# Patient Record
Sex: Female | Born: 1970 | Race: White | Hispanic: No | Marital: Single | Smoking: Current every day smoker
Health system: Southern US, Community
[De-identification: ages and names within clinical notes are randomized; demographics above are authoritative.]

## PROBLEM LIST (undated history)

## (undated) DIAGNOSIS — F419 Anxiety disorder, unspecified: Secondary | ICD-10-CM

---

## 1997-09-19 ENCOUNTER — Emergency Department (HOSPITAL_COMMUNITY): Admission: EM | Admit: 1997-09-19 | Discharge: 1997-09-19 | Payer: Self-pay | Admitting: Emergency Medicine

## 1998-01-02 ENCOUNTER — Emergency Department (HOSPITAL_COMMUNITY): Admission: EM | Admit: 1998-01-02 | Discharge: 1998-01-02 | Payer: Self-pay | Admitting: Internal Medicine

## 1998-03-16 ENCOUNTER — Inpatient Hospital Stay (HOSPITAL_COMMUNITY): Admission: AD | Admit: 1998-03-16 | Discharge: 1998-03-16 | Payer: Self-pay | Admitting: Obstetrics

## 1999-06-04 ENCOUNTER — Emergency Department (HOSPITAL_COMMUNITY): Admission: EM | Admit: 1999-06-04 | Discharge: 1999-06-04 | Payer: Self-pay | Admitting: Emergency Medicine

## 1999-07-17 ENCOUNTER — Encounter: Payer: Self-pay | Admitting: Emergency Medicine

## 1999-07-17 ENCOUNTER — Emergency Department (HOSPITAL_COMMUNITY): Admission: EM | Admit: 1999-07-17 | Discharge: 1999-07-17 | Payer: Self-pay | Admitting: Emergency Medicine

## 1999-07-20 ENCOUNTER — Emergency Department (HOSPITAL_COMMUNITY): Admission: EM | Admit: 1999-07-20 | Discharge: 1999-07-20 | Payer: Self-pay | Admitting: Emergency Medicine

## 1999-07-23 ENCOUNTER — Inpatient Hospital Stay (HOSPITAL_COMMUNITY): Admission: AD | Admit: 1999-07-23 | Discharge: 1999-07-23 | Payer: Self-pay | Admitting: Obstetrics & Gynecology

## 1999-07-24 ENCOUNTER — Inpatient Hospital Stay (HOSPITAL_COMMUNITY): Admission: AD | Admit: 1999-07-24 | Discharge: 1999-07-24 | Payer: Self-pay | Admitting: Obstetrics

## 1999-12-26 ENCOUNTER — Encounter: Payer: Self-pay | Admitting: *Deleted

## 1999-12-26 ENCOUNTER — Inpatient Hospital Stay (HOSPITAL_COMMUNITY): Admission: EM | Admit: 1999-12-26 | Discharge: 1999-12-26 | Payer: Self-pay | Admitting: *Deleted

## 2000-03-09 ENCOUNTER — Inpatient Hospital Stay (HOSPITAL_COMMUNITY): Admission: AD | Admit: 2000-03-09 | Discharge: 2000-03-09 | Payer: Self-pay | Admitting: *Deleted

## 2000-03-09 ENCOUNTER — Encounter: Payer: Self-pay | Admitting: *Deleted

## 2000-06-05 ENCOUNTER — Encounter: Admission: RE | Admit: 2000-06-05 | Discharge: 2000-06-05 | Payer: Self-pay | Admitting: Family Medicine

## 2000-07-04 ENCOUNTER — Encounter: Admission: RE | Admit: 2000-07-04 | Discharge: 2000-07-04 | Payer: Self-pay | Admitting: Family Medicine

## 2000-07-22 ENCOUNTER — Encounter: Admission: RE | Admit: 2000-07-22 | Discharge: 2000-07-22 | Payer: Self-pay | Admitting: Family Medicine

## 2000-07-22 ENCOUNTER — Other Ambulatory Visit: Admission: RE | Admit: 2000-07-22 | Discharge: 2000-07-22 | Payer: Self-pay | Admitting: *Deleted

## 2000-07-24 ENCOUNTER — Encounter: Admission: RE | Admit: 2000-07-24 | Discharge: 2000-07-24 | Payer: Self-pay | Admitting: Family Medicine

## 2000-08-21 ENCOUNTER — Encounter: Admission: RE | Admit: 2000-08-21 | Discharge: 2000-08-21 | Payer: Self-pay | Admitting: Family Medicine

## 2000-08-21 ENCOUNTER — Ambulatory Visit (HOSPITAL_COMMUNITY): Admission: RE | Admit: 2000-08-21 | Discharge: 2000-08-21 | Payer: Self-pay | Admitting: *Deleted

## 2000-08-25 ENCOUNTER — Inpatient Hospital Stay (HOSPITAL_COMMUNITY): Admission: AD | Admit: 2000-08-25 | Discharge: 2000-08-25 | Payer: Self-pay | Admitting: Obstetrics

## 2000-09-04 ENCOUNTER — Encounter: Admission: RE | Admit: 2000-09-04 | Discharge: 2000-09-04 | Payer: Self-pay | Admitting: *Deleted

## 2000-09-12 ENCOUNTER — Encounter: Admission: RE | Admit: 2000-09-12 | Discharge: 2000-09-12 | Payer: Self-pay | Admitting: Obstetrics

## 2000-09-18 ENCOUNTER — Observation Stay (HOSPITAL_COMMUNITY): Admission: AD | Admit: 2000-09-18 | Discharge: 2000-09-18 | Payer: Self-pay | Admitting: *Deleted

## 2000-09-21 ENCOUNTER — Inpatient Hospital Stay (HOSPITAL_COMMUNITY): Admission: AD | Admit: 2000-09-21 | Discharge: 2000-09-21 | Payer: Self-pay | Admitting: *Deleted

## 2000-09-26 ENCOUNTER — Ambulatory Visit (HOSPITAL_COMMUNITY): Admission: RE | Admit: 2000-09-26 | Discharge: 2000-09-26 | Payer: Self-pay | Admitting: Obstetrics

## 2000-09-26 ENCOUNTER — Encounter: Payer: Self-pay | Admitting: Obstetrics

## 2000-09-26 ENCOUNTER — Encounter: Admission: RE | Admit: 2000-09-26 | Discharge: 2000-09-26 | Payer: Self-pay | Admitting: Obstetrics

## 2000-10-10 ENCOUNTER — Encounter: Payer: Self-pay | Admitting: Obstetrics

## 2000-10-10 ENCOUNTER — Ambulatory Visit (HOSPITAL_COMMUNITY): Admission: RE | Admit: 2000-10-10 | Discharge: 2000-10-10 | Payer: Self-pay | Admitting: Obstetrics

## 2000-10-10 ENCOUNTER — Encounter: Admission: RE | Admit: 2000-10-10 | Discharge: 2000-10-10 | Payer: Self-pay | Admitting: Obstetrics

## 2000-10-24 ENCOUNTER — Encounter: Admission: RE | Admit: 2000-10-24 | Discharge: 2000-10-24 | Payer: Self-pay | Admitting: Obstetrics

## 2000-11-13 ENCOUNTER — Inpatient Hospital Stay (HOSPITAL_COMMUNITY): Admission: AD | Admit: 2000-11-13 | Discharge: 2000-11-13 | Payer: Self-pay | Admitting: Obstetrics

## 2000-11-14 ENCOUNTER — Encounter: Admission: RE | Admit: 2000-11-14 | Discharge: 2000-11-14 | Payer: Self-pay | Admitting: Obstetrics

## 2000-11-14 ENCOUNTER — Ambulatory Visit (HOSPITAL_COMMUNITY): Admission: RE | Admit: 2000-11-14 | Discharge: 2000-11-14 | Payer: Self-pay | Admitting: Obstetrics

## 2000-11-28 ENCOUNTER — Encounter: Admission: RE | Admit: 2000-11-28 | Discharge: 2000-11-28 | Payer: Self-pay | Admitting: Obstetrics

## 2000-11-29 ENCOUNTER — Observation Stay (HOSPITAL_COMMUNITY): Admission: AD | Admit: 2000-11-29 | Discharge: 2000-11-30 | Payer: Self-pay | Admitting: Obstetrics

## 2000-11-29 ENCOUNTER — Encounter: Payer: Self-pay | Admitting: Obstetrics

## 2000-12-05 ENCOUNTER — Encounter: Admission: RE | Admit: 2000-12-05 | Discharge: 2000-12-05 | Payer: Self-pay | Admitting: Family Medicine

## 2001-01-01 ENCOUNTER — Encounter: Admission: RE | Admit: 2001-01-01 | Discharge: 2001-01-01 | Payer: Self-pay | Admitting: Family Medicine

## 2001-03-04 ENCOUNTER — Encounter: Admission: RE | Admit: 2001-03-04 | Discharge: 2001-03-04 | Payer: Self-pay | Admitting: Sports Medicine

## 2001-04-17 ENCOUNTER — Encounter: Admission: RE | Admit: 2001-04-17 | Discharge: 2001-04-17 | Payer: Self-pay | Admitting: Family Medicine

## 2001-04-27 ENCOUNTER — Inpatient Hospital Stay (HOSPITAL_COMMUNITY): Admission: AD | Admit: 2001-04-27 | Discharge: 2001-04-27 | Payer: Self-pay | Admitting: *Deleted

## 2001-05-12 ENCOUNTER — Encounter: Admission: RE | Admit: 2001-05-12 | Discharge: 2001-05-12 | Payer: Self-pay | Admitting: Family Medicine

## 2001-05-13 ENCOUNTER — Ambulatory Visit (HOSPITAL_COMMUNITY): Admission: RE | Admit: 2001-05-13 | Discharge: 2001-05-13 | Payer: Self-pay | Admitting: *Deleted

## 2001-05-18 ENCOUNTER — Inpatient Hospital Stay (HOSPITAL_COMMUNITY): Admission: AD | Admit: 2001-05-18 | Discharge: 2001-05-18 | Payer: Self-pay | Admitting: Obstetrics & Gynecology

## 2001-05-29 ENCOUNTER — Encounter: Admission: RE | Admit: 2001-05-29 | Discharge: 2001-05-29 | Payer: Self-pay | Admitting: Family Medicine

## 2001-06-01 ENCOUNTER — Inpatient Hospital Stay (HOSPITAL_COMMUNITY): Admission: AD | Admit: 2001-06-01 | Discharge: 2001-06-01 | Payer: Self-pay | Admitting: Obstetrics & Gynecology

## 2001-06-03 ENCOUNTER — Ambulatory Visit (HOSPITAL_COMMUNITY): Admission: RE | Admit: 2001-06-03 | Discharge: 2001-06-03 | Payer: Self-pay | Admitting: *Deleted

## 2001-06-06 ENCOUNTER — Encounter (INDEPENDENT_AMBULATORY_CARE_PROVIDER_SITE_OTHER): Payer: Self-pay | Admitting: Specialist

## 2001-06-06 ENCOUNTER — Encounter: Admission: RE | Admit: 2001-06-06 | Discharge: 2001-06-06 | Payer: Self-pay | Admitting: Family Medicine

## 2001-06-06 ENCOUNTER — Other Ambulatory Visit: Admission: RE | Admit: 2001-06-06 | Discharge: 2001-06-06 | Payer: Self-pay | Admitting: *Deleted

## 2001-06-08 ENCOUNTER — Inpatient Hospital Stay (HOSPITAL_COMMUNITY): Admission: AD | Admit: 2001-06-08 | Discharge: 2001-06-08 | Payer: Self-pay | Admitting: Obstetrics

## 2001-06-30 ENCOUNTER — Emergency Department (HOSPITAL_COMMUNITY): Admission: EM | Admit: 2001-06-30 | Discharge: 2001-06-30 | Payer: Self-pay | Admitting: Emergency Medicine

## 2001-07-04 ENCOUNTER — Observation Stay (HOSPITAL_COMMUNITY): Admission: RE | Admit: 2001-07-04 | Discharge: 2001-07-05 | Payer: Self-pay | Admitting: *Deleted

## 2001-07-04 ENCOUNTER — Encounter: Payer: Self-pay | Admitting: *Deleted

## 2001-07-07 ENCOUNTER — Inpatient Hospital Stay (HOSPITAL_COMMUNITY): Admission: AD | Admit: 2001-07-07 | Discharge: 2001-07-07 | Payer: Self-pay | Admitting: Obstetrics & Gynecology

## 2001-07-11 ENCOUNTER — Encounter (HOSPITAL_COMMUNITY): Admission: AD | Admit: 2001-07-11 | Discharge: 2001-08-10 | Payer: Self-pay | Admitting: *Deleted

## 2001-08-12 ENCOUNTER — Encounter (HOSPITAL_COMMUNITY): Admission: RE | Admit: 2001-08-12 | Discharge: 2001-09-11 | Payer: Self-pay | Admitting: *Deleted

## 2001-08-17 ENCOUNTER — Inpatient Hospital Stay (HOSPITAL_COMMUNITY): Admission: AD | Admit: 2001-08-17 | Discharge: 2001-08-17 | Payer: Self-pay | Admitting: Obstetrics and Gynecology

## 2001-08-17 ENCOUNTER — Encounter: Payer: Self-pay | Admitting: Obstetrics and Gynecology

## 2001-08-27 ENCOUNTER — Inpatient Hospital Stay (HOSPITAL_COMMUNITY): Admission: AD | Admit: 2001-08-27 | Discharge: 2001-08-27 | Payer: Self-pay | Admitting: Obstetrics and Gynecology

## 2001-09-12 ENCOUNTER — Observation Stay (HOSPITAL_COMMUNITY): Admission: AD | Admit: 2001-09-12 | Discharge: 2001-09-12 | Payer: Self-pay | Admitting: *Deleted

## 2001-09-14 ENCOUNTER — Inpatient Hospital Stay (HOSPITAL_COMMUNITY): Admission: AD | Admit: 2001-09-14 | Discharge: 2001-09-14 | Payer: Self-pay | Admitting: Obstetrics and Gynecology

## 2001-09-17 ENCOUNTER — Inpatient Hospital Stay (HOSPITAL_COMMUNITY): Admission: AD | Admit: 2001-09-17 | Discharge: 2001-09-17 | Payer: Self-pay | Admitting: Obstetrics and Gynecology

## 2001-09-19 ENCOUNTER — Inpatient Hospital Stay (HOSPITAL_COMMUNITY): Admission: AD | Admit: 2001-09-19 | Discharge: 2001-09-19 | Payer: Self-pay | Admitting: *Deleted

## 2001-09-19 ENCOUNTER — Encounter: Payer: Self-pay | Admitting: *Deleted

## 2001-09-20 ENCOUNTER — Encounter (INDEPENDENT_AMBULATORY_CARE_PROVIDER_SITE_OTHER): Payer: Self-pay

## 2001-09-20 ENCOUNTER — Observation Stay (HOSPITAL_COMMUNITY): Admission: AD | Admit: 2001-09-20 | Discharge: 2001-09-20 | Payer: Self-pay | Admitting: *Deleted

## 2001-09-25 ENCOUNTER — Encounter: Admission: RE | Admit: 2001-09-25 | Discharge: 2001-09-25 | Payer: Self-pay | Admitting: Family Medicine

## 2001-09-30 ENCOUNTER — Encounter: Admission: RE | Admit: 2001-09-30 | Discharge: 2001-09-30 | Payer: Self-pay | Admitting: Family Medicine

## 2001-10-06 ENCOUNTER — Encounter: Admission: RE | Admit: 2001-10-06 | Discharge: 2001-10-06 | Payer: Self-pay | Admitting: Family Medicine

## 2001-10-06 ENCOUNTER — Inpatient Hospital Stay (HOSPITAL_COMMUNITY): Admission: EM | Admit: 2001-10-06 | Discharge: 2001-10-08 | Payer: Self-pay | Admitting: Psychiatry

## 2001-10-22 ENCOUNTER — Encounter: Admission: RE | Admit: 2001-10-22 | Discharge: 2001-10-22 | Payer: Self-pay | Admitting: Family Medicine

## 2001-12-04 ENCOUNTER — Encounter: Admission: RE | Admit: 2001-12-04 | Discharge: 2001-12-04 | Payer: Self-pay | Admitting: Family Medicine

## 2002-01-06 ENCOUNTER — Encounter: Admission: RE | Admit: 2002-01-06 | Discharge: 2002-01-06 | Payer: Self-pay | Admitting: Family Medicine

## 2002-01-09 ENCOUNTER — Encounter: Admission: RE | Admit: 2002-01-09 | Discharge: 2002-01-09 | Payer: Self-pay | Admitting: Family Medicine

## 2002-01-24 ENCOUNTER — Inpatient Hospital Stay (HOSPITAL_COMMUNITY): Admission: AD | Admit: 2002-01-24 | Discharge: 2002-01-24 | Payer: Self-pay | Admitting: *Deleted

## 2002-03-19 ENCOUNTER — Emergency Department (HOSPITAL_COMMUNITY): Admission: EM | Admit: 2002-03-19 | Discharge: 2002-03-19 | Payer: Self-pay | Admitting: Emergency Medicine

## 2002-04-15 ENCOUNTER — Encounter: Admission: RE | Admit: 2002-04-15 | Discharge: 2002-04-15 | Payer: Self-pay | Admitting: Family Medicine

## 2007-12-21 ENCOUNTER — Emergency Department (HOSPITAL_COMMUNITY): Admission: EM | Admit: 2007-12-21 | Discharge: 2007-12-21 | Payer: Self-pay | Admitting: Emergency Medicine

## 2008-03-09 ENCOUNTER — Emergency Department (HOSPITAL_COMMUNITY): Admission: EM | Admit: 2008-03-09 | Discharge: 2008-03-09 | Payer: Self-pay | Admitting: Emergency Medicine

## 2008-10-01 ENCOUNTER — Emergency Department (HOSPITAL_COMMUNITY): Admission: EM | Admit: 2008-10-01 | Discharge: 2008-10-01 | Payer: Self-pay | Admitting: Emergency Medicine

## 2008-11-16 ENCOUNTER — Inpatient Hospital Stay (HOSPITAL_COMMUNITY): Admission: AD | Admit: 2008-11-16 | Discharge: 2008-11-16 | Payer: Self-pay | Admitting: Obstetrics & Gynecology

## 2009-01-11 ENCOUNTER — Ambulatory Visit: Payer: Self-pay | Admitting: Obstetrics and Gynecology

## 2009-01-26 ENCOUNTER — Inpatient Hospital Stay (HOSPITAL_COMMUNITY): Admission: AD | Admit: 2009-01-26 | Discharge: 2009-01-26 | Payer: Self-pay | Admitting: Obstetrics & Gynecology

## 2009-01-27 ENCOUNTER — Ambulatory Visit: Payer: Self-pay | Admitting: Obstetrics & Gynecology

## 2009-01-27 ENCOUNTER — Encounter (INDEPENDENT_AMBULATORY_CARE_PROVIDER_SITE_OTHER): Payer: Self-pay | Admitting: *Deleted

## 2009-01-27 LAB — CONVERTED CEMR LAB
Clue Cells Wet Prep HPF POC: NONE SEEN
GC Probe Amp, Genital: NEGATIVE

## 2009-01-28 ENCOUNTER — Ambulatory Visit (HOSPITAL_COMMUNITY): Admission: RE | Admit: 2009-01-28 | Discharge: 2009-01-28 | Payer: Self-pay | Admitting: Obstetrics & Gynecology

## 2009-02-03 ENCOUNTER — Ambulatory Visit: Payer: Self-pay | Admitting: Family Medicine

## 2009-02-03 ENCOUNTER — Ambulatory Visit (HOSPITAL_COMMUNITY): Admission: RE | Admit: 2009-02-03 | Discharge: 2009-02-03 | Payer: Self-pay | Admitting: Obstetrics and Gynecology

## 2009-02-03 ENCOUNTER — Encounter: Payer: Self-pay | Admitting: Obstetrics and Gynecology

## 2009-02-03 LAB — CONVERTED CEMR LAB: Hepatitis B Surface Ag: NEGATIVE

## 2009-02-21 ENCOUNTER — Ambulatory Visit: Payer: Self-pay | Admitting: Obstetrics and Gynecology

## 2009-02-21 ENCOUNTER — Inpatient Hospital Stay (HOSPITAL_COMMUNITY): Admission: AD | Admit: 2009-02-21 | Discharge: 2009-02-21 | Payer: Self-pay | Admitting: Obstetrics & Gynecology

## 2009-02-24 ENCOUNTER — Ambulatory Visit: Payer: Self-pay | Admitting: Obstetrics & Gynecology

## 2009-02-24 ENCOUNTER — Encounter: Payer: Self-pay | Admitting: Obstetrics and Gynecology

## 2009-02-25 ENCOUNTER — Encounter: Payer: Self-pay | Admitting: Obstetrics and Gynecology

## 2009-02-25 LAB — CONVERTED CEMR LAB
Amphetamine Screen, Ur: NEGATIVE
Barbiturate Quant, Ur: NEGATIVE
Marijuana Metabolite: POSITIVE — AB
Methadone: NEGATIVE
Opiate Screen, Urine: NEGATIVE

## 2009-03-03 ENCOUNTER — Ambulatory Visit (HOSPITAL_COMMUNITY): Admission: RE | Admit: 2009-03-03 | Discharge: 2009-03-03 | Payer: Self-pay | Admitting: Obstetrics & Gynecology

## 2009-03-03 ENCOUNTER — Ambulatory Visit: Payer: Self-pay | Admitting: Obstetrics & Gynecology

## 2009-03-17 ENCOUNTER — Ambulatory Visit: Payer: Self-pay | Admitting: Obstetrics & Gynecology

## 2009-03-17 ENCOUNTER — Encounter: Payer: Self-pay | Admitting: Physician Assistant

## 2009-03-24 ENCOUNTER — Inpatient Hospital Stay (HOSPITAL_COMMUNITY): Admission: AD | Admit: 2009-03-24 | Discharge: 2009-03-24 | Payer: Self-pay | Admitting: Family Medicine

## 2009-03-28 ENCOUNTER — Ambulatory Visit: Payer: Self-pay | Admitting: Family Medicine

## 2009-04-09 ENCOUNTER — Inpatient Hospital Stay (HOSPITAL_COMMUNITY): Admission: AD | Admit: 2009-04-09 | Discharge: 2009-04-09 | Payer: Self-pay | Admitting: Obstetrics & Gynecology

## 2009-04-18 ENCOUNTER — Ambulatory Visit: Payer: Self-pay | Admitting: Obstetrics & Gynecology

## 2009-04-19 ENCOUNTER — Ambulatory Visit: Payer: Self-pay | Admitting: Obstetrics and Gynecology

## 2009-04-19 ENCOUNTER — Inpatient Hospital Stay (HOSPITAL_COMMUNITY): Admission: AD | Admit: 2009-04-19 | Discharge: 2009-04-19 | Payer: Self-pay | Admitting: Obstetrics and Gynecology

## 2009-04-22 ENCOUNTER — Inpatient Hospital Stay (HOSPITAL_COMMUNITY): Admission: AD | Admit: 2009-04-22 | Discharge: 2009-04-22 | Payer: Self-pay | Admitting: Obstetrics & Gynecology

## 2009-05-05 ENCOUNTER — Ambulatory Visit: Payer: Self-pay | Admitting: Obstetrics & Gynecology

## 2009-05-05 ENCOUNTER — Encounter: Payer: Self-pay | Admitting: Obstetrics and Gynecology

## 2009-05-06 ENCOUNTER — Encounter: Payer: Self-pay | Admitting: Obstetrics and Gynecology

## 2009-05-07 ENCOUNTER — Ambulatory Visit: Payer: Self-pay | Admitting: Advanced Practice Midwife

## 2009-05-07 ENCOUNTER — Inpatient Hospital Stay (HOSPITAL_COMMUNITY): Admission: AD | Admit: 2009-05-07 | Discharge: 2009-05-07 | Payer: Self-pay | Admitting: Obstetrics & Gynecology

## 2009-05-09 ENCOUNTER — Ambulatory Visit: Payer: Self-pay | Admitting: Family

## 2009-05-09 ENCOUNTER — Inpatient Hospital Stay (HOSPITAL_COMMUNITY): Admission: RE | Admit: 2009-05-09 | Discharge: 2009-05-09 | Payer: Self-pay | Admitting: Family Medicine

## 2009-05-09 ENCOUNTER — Ambulatory Visit: Payer: Self-pay | Admitting: Obstetrics & Gynecology

## 2009-05-10 ENCOUNTER — Encounter: Payer: Self-pay | Admitting: Obstetrics and Gynecology

## 2009-05-12 ENCOUNTER — Ambulatory Visit: Payer: Self-pay | Admitting: Obstetrics and Gynecology

## 2009-05-12 ENCOUNTER — Inpatient Hospital Stay (HOSPITAL_COMMUNITY): Admission: AD | Admit: 2009-05-12 | Discharge: 2009-05-16 | Payer: Self-pay | Admitting: Obstetrics and Gynecology

## 2009-05-20 ENCOUNTER — Inpatient Hospital Stay (HOSPITAL_COMMUNITY): Admission: AD | Admit: 2009-05-20 | Discharge: 2009-05-20 | Payer: Self-pay | Admitting: Obstetrics & Gynecology

## 2009-05-20 ENCOUNTER — Ambulatory Visit: Payer: Self-pay | Admitting: Advanced Practice Midwife

## 2009-05-25 ENCOUNTER — Inpatient Hospital Stay (HOSPITAL_COMMUNITY): Admission: AD | Admit: 2009-05-25 | Discharge: 2009-05-26 | Payer: Self-pay | Admitting: *Deleted

## 2010-07-09 ENCOUNTER — Encounter: Payer: Self-pay | Admitting: *Deleted

## 2010-07-10 ENCOUNTER — Encounter: Payer: Self-pay | Admitting: *Deleted

## 2010-09-19 LAB — CBC
HCT: 34.1 % — ABNORMAL LOW (ref 36.0–46.0)
Hemoglobin: 11.5 g/dL — ABNORMAL LOW (ref 12.0–15.0)
MCHC: 33.6 g/dL (ref 30.0–36.0)
RDW: 13.4 % (ref 11.5–15.5)

## 2010-09-20 LAB — URIC ACID: Uric Acid, Serum: 4.8 mg/dL (ref 2.4–7.0)

## 2010-09-20 LAB — URINALYSIS, MICROSCOPIC ONLY
Bilirubin Urine: NEGATIVE
Protein, ur: NEGATIVE mg/dL
Urobilinogen, UA: 0.2 mg/dL (ref 0.0–1.0)

## 2010-09-20 LAB — COMPREHENSIVE METABOLIC PANEL
ALT: 13 U/L (ref 0–35)
ALT: 13 U/L (ref 0–35)
ALT: 16 U/L (ref 0–35)
ALT: 16 U/L (ref 0–35)
AST: 29 U/L (ref 0–37)
AST: 22 U/L (ref 0–37)
Albumin: 2.2 g/dL — ABNORMAL LOW (ref 3.5–5.2)
Albumin: 2.5 g/dL — ABNORMAL LOW (ref 3.5–5.2)
Albumin: 2.6 g/dL — ABNORMAL LOW (ref 3.5–5.2)
Albumin: 2.5 g/dL — ABNORMAL LOW (ref 3.5–5.2)
Alkaline Phosphatase: 120 U/L — ABNORMAL HIGH (ref 39–117)
Alkaline Phosphatase: 130 U/L — ABNORMAL HIGH (ref 39–117)
Alkaline Phosphatase: 143 U/L — ABNORMAL HIGH (ref 39–117)
BUN: 10 mg/dL (ref 6–23)
BUN: 6 mg/dL (ref 6–23)
BUN: 8 mg/dL (ref 6–23)
BUN: 9 mg/dL (ref 6–23)
CO2: 21 mEq/L (ref 19–32)
CO2: 23 mEq/L (ref 19–32)
CO2: 26 mEq/L (ref 19–32)
Calcium: 8.3 mg/dL — ABNORMAL LOW (ref 8.4–10.5)
Calcium: 8.3 mg/dL — ABNORMAL LOW (ref 8.4–10.5)
Calcium: 8.6 mg/dL (ref 8.4–10.5)
Calcium: 8.9 mg/dL (ref 8.4–10.5)
Calcium: 8.4 mg/dL (ref 8.4–10.5)
Chloride: 104 mEq/L (ref 96–112)
Creatinine, Ser: 0.57 mg/dL (ref 0.4–1.2)
Creatinine, Ser: 0.59 mg/dL (ref 0.4–1.2)
Creatinine, Ser: 0.63 mg/dL (ref 0.4–1.2)
Creatinine, Ser: 0.66 mg/dL (ref 0.4–1.2)
GFR calc Af Amer: >60 mL/min (ref >60)
GFR calc Af Amer: >60 mL/min (ref >60)
GFR calc Af Amer: >60 mL/min (ref >60)
GFR calc non Af Amer: >60 mL/min (ref >60)
GFR calc non Af Amer: >60 mL/min (ref >60)
GFR calc non Af Amer: >60 mL/min (ref >60)
Glucose, Bld: 90 mg/dL (ref 70–99)
Glucose, Bld: 93 mg/dL (ref 70–99)
Potassium: 3.3 mEq/L — ABNORMAL LOW (ref 3.5–5.1)
Potassium: 3.7 mEq/L (ref 3.5–5.1)
Sodium: 135 mEq/L (ref 135–145)
Sodium: 136 mEq/L (ref 135–145)
Sodium: 135 mEq/L (ref 135–145)
Total Bilirubin: 0.2 mg/dL — ABNORMAL LOW (ref 0.3–1.2)
Total Bilirubin: 0.5 mg/dL (ref 0.3–1.2)
Total Protein: 5.3 g/dL — ABNORMAL LOW (ref 6.0–8.3)
Total Protein: 5.7 g/dL — ABNORMAL LOW (ref 6.0–8.3)
Total Protein: 5.8 g/dL — ABNORMAL LOW (ref 6.0–8.3)
Total Protein: 5.7 g/dL — ABNORMAL LOW (ref 6.0–8.3)

## 2010-09-20 LAB — URINALYSIS, ROUTINE W REFLEX MICROSCOPIC
Bilirubin Urine: NEGATIVE
Bilirubin Urine: NEGATIVE
Bilirubin Urine: NEGATIVE
Glucose, UA: NEGATIVE mg/dL
Glucose, UA: NEGATIVE mg/dL
Glucose, UA: NEGATIVE mg/dL
Hgb urine dipstick: NEGATIVE
Ketones, ur: 40 mg/dL — AB
Nitrite: NEGATIVE
Protein, ur: 30 mg/dL — AB
Protein, ur: 30 mg/dL — AB
Specific Gravity, Urine: 1.025 (ref 1.005–1.030)
Specific Gravity, Urine: >1.03 — ABNORMAL HIGH (ref 1.005–1.030)
Urobilinogen, UA: 0.2 mg/dL (ref 0.0–1.0)
pH: 6 (ref 5.0–8.0)
pH: 6.5 (ref 5.0–8.0)
pH: 6.5 (ref 5.0–8.0)

## 2010-09-20 LAB — CBC
HCT: 27.9 % — ABNORMAL LOW (ref 36.0–46.0)
HCT: 28.1 % — ABNORMAL LOW (ref 36.0–46.0)
HCT: 33.7 % — ABNORMAL LOW (ref 36.0–46.0)
HCT: 34.1 % — ABNORMAL LOW (ref 36.0–46.0)
HCT: 35.6 % — ABNORMAL LOW (ref 36.0–46.0)
Hemoglobin: 11.6 g/dL — ABNORMAL LOW (ref 12.0–15.0)
Hemoglobin: 9.3 g/dL — ABNORMAL LOW (ref 12.0–15.0)
Hemoglobin: 9.6 g/dL — ABNORMAL LOW (ref 12.0–15.0)
MCHC: 33.8 g/dL (ref 30.0–36.0)
MCHC: 33.9 g/dL (ref 30.0–36.0)
MCHC: 34.3 g/dL (ref 30.0–36.0)
MCHC: 34.4 g/dL (ref 30.0–36.0)
MCHC: 34.4 g/dL (ref 30.0–36.0)
MCV: 88 fL (ref 78.0–100.0)
MCV: 88.4 fL (ref 78.0–100.0)
MCV: 88.6 fL (ref 78.0–100.0)
MCV: 89.1 fL (ref 78.0–100.0)
MCV: 87.5 fL (ref 78.0–100.0)
Platelets: 175 10*3/uL (ref 150–400)
Platelets: 181 10*3/uL (ref 150–400)
Platelets: 192 10*3/uL (ref 150–400)
Platelets: 208 10*3/uL (ref 150–400)
RBC: 3.09 MIL/uL — ABNORMAL LOW (ref 3.87–5.11)
RBC: 3.16 MIL/uL — ABNORMAL LOW (ref 3.87–5.11)
RBC: 3.84 MIL/uL — ABNORMAL LOW (ref 3.87–5.11)
RBC: 4.05 MIL/uL (ref 3.87–5.11)
RBC: 3.72 MIL/uL — ABNORMAL LOW (ref 3.87–5.11)
RDW: 14.5 % (ref 11.5–15.5)
RDW: 14.6 % (ref 11.5–15.5)
RDW: 14.7 % (ref 11.5–15.5)
RDW: 14.9 % (ref 11.5–15.5)
RDW: 14.3 % (ref 11.5–15.5)
WBC: 13 10*3/uL — ABNORMAL HIGH (ref 4.0–10.5)

## 2010-09-20 LAB — URINE MICROSCOPIC-ADD ON

## 2010-09-20 LAB — RAPID URINE DRUG SCREEN, HOSP PERFORMED
Barbiturates: NOT DETECTED
Barbiturates: NOT DETECTED
Benzodiazepines: POSITIVE — AB
Benzodiazepines: NOT DETECTED
Opiates: NOT DETECTED

## 2010-09-20 LAB — WET PREP, GENITAL
Clue Cells Wet Prep HPF POC: NONE SEEN
Clue Cells Wet Prep HPF POC: NONE SEEN
Trich, Wet Prep: NONE SEEN
Yeast Wet Prep HPF POC: NONE SEEN

## 2010-09-20 LAB — LACTATE DEHYDROGENASE: LDH: 160 U/L (ref 94–250)

## 2010-09-20 LAB — PROTEIN, URINE, 24 HOUR
Collection Interval-UPROT: 24 hours
Protein, Urine: 27 mg/dL

## 2010-09-20 LAB — POCT URINALYSIS DIP (DEVICE)
Bilirubin Urine: NEGATIVE
Glucose, UA: NEGATIVE mg/dL
Glucose, UA: NEGATIVE mg/dL
Hgb urine dipstick: NEGATIVE
Ketones, ur: NEGATIVE mg/dL
Nitrite: NEGATIVE
Protein, ur: 100 mg/dL — AB
Specific Gravity, Urine: 1.025 (ref 1.005–1.030)
Specific Gravity, Urine: 1.02 (ref 1.005–1.030)
Urobilinogen, UA: 1 mg/dL (ref 0.0–1.0)
pH: 6.5 (ref 5.0–8.0)

## 2010-09-20 LAB — URINE CULTURE
Colony Count: NO GROWTH
Culture: NO GROWTH

## 2010-09-20 LAB — CREATININE CLEARANCE, URINE, 24 HOUR
Creatinine Clearance: 111 mL/min (ref 75–115)
Creatinine, 24H Ur: 945 mg/d (ref 700–1800)

## 2010-09-22 LAB — RAPID URINE DRUG SCREEN, HOSP PERFORMED
Amphetamines: NOT DETECTED
Benzodiazepines: POSITIVE — AB
Cocaine: POSITIVE — AB
Opiates: NOT DETECTED
Opiates: NOT DETECTED
Tetrahydrocannabinol: POSITIVE — AB
Tetrahydrocannabinol: POSITIVE — AB

## 2010-09-22 LAB — COMPREHENSIVE METABOLIC PANEL
Albumin: 2.7 g/dL — ABNORMAL LOW (ref 3.5–5.2)
BUN: 4 mg/dL — ABNORMAL LOW (ref 6–23)
Calcium: 9.3 mg/dL (ref 8.4–10.5)
Creatinine, Ser: 0.51 mg/dL (ref 0.4–1.2)
Potassium: 3.1 mEq/L — ABNORMAL LOW (ref 3.5–5.1)
Total Protein: 5.8 g/dL — ABNORMAL LOW (ref 6.0–8.3)

## 2010-09-22 LAB — POCT URINALYSIS DIP (DEVICE)
Hgb urine dipstick: NEGATIVE
Hgb urine dipstick: NEGATIVE
Ketones, ur: >=160 mg/dL — AB
Ketones, ur: NEGATIVE mg/dL
Ketones, ur: NEGATIVE mg/dL
Nitrite: NEGATIVE
Protein, ur: 30 mg/dL — AB
Protein, ur: NEGATIVE mg/dL
Protein, ur: 100 mg/dL — AB
Specific Gravity, Urine: 1.01 (ref 1.005–1.030)
Specific Gravity, Urine: 1.015 (ref 1.005–1.030)
Specific Gravity, Urine: 1.025 (ref 1.005–1.030)
Urobilinogen, UA: 2 mg/dL — ABNORMAL HIGH (ref 0.0–1.0)
pH: 6 (ref 5.0–8.0)
pH: 7 (ref 5.0–8.0)

## 2010-09-22 LAB — URINALYSIS, ROUTINE W REFLEX MICROSCOPIC
Bilirubin Urine: NEGATIVE
Hgb urine dipstick: NEGATIVE
Specific Gravity, Urine: 1.015 (ref 1.005–1.030)
Urobilinogen, UA: 1 mg/dL (ref 0.0–1.0)
pH: 6.5 (ref 5.0–8.0)

## 2010-09-22 LAB — CBC
HCT: 34.7 % — ABNORMAL LOW (ref 36.0–46.0)
MCV: 87.7 fL (ref 78.0–100.0)
Platelets: 260 10*3/uL (ref 150–400)
RDW: 14 % (ref 11.5–15.5)

## 2010-09-22 LAB — URINE MICROSCOPIC-ADD ON

## 2010-09-23 LAB — POCT URINALYSIS DIP (DEVICE)
Glucose, UA: NEGATIVE mg/dL
Protein, ur: NEGATIVE mg/dL
Specific Gravity, Urine: 1.02 (ref 1.005–1.030)
Urobilinogen, UA: 1 mg/dL (ref 0.0–1.0)

## 2010-09-23 IMAGING — CR DG CHEST 2V
2 series · 2 of 2 positions shown · non-contrast
Comparison: None.

CLINICAL DATA: 35-week pregnant patient presenting with coughing
and vomiting.  Smoker.

CHEST - 2 VIEW 04/22/2009:

[view not recorded (1 of 2)]
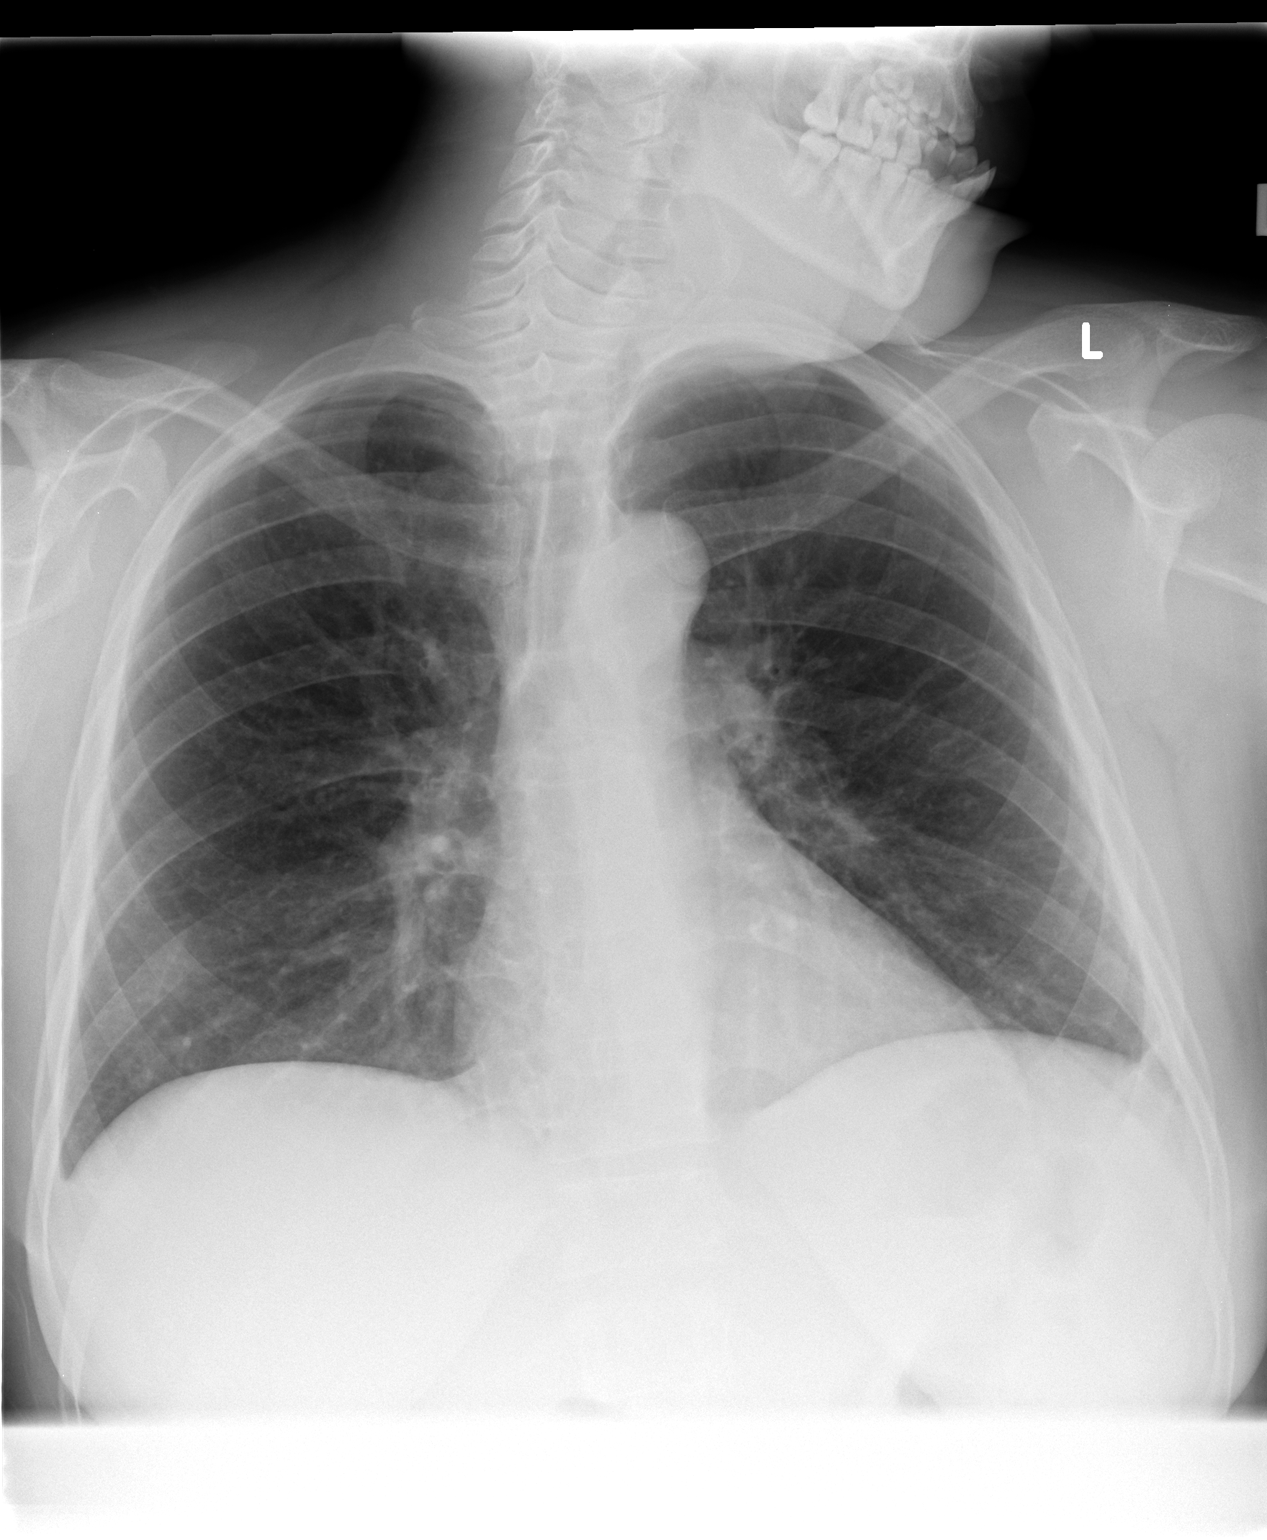

[view not recorded (2 of 2)]
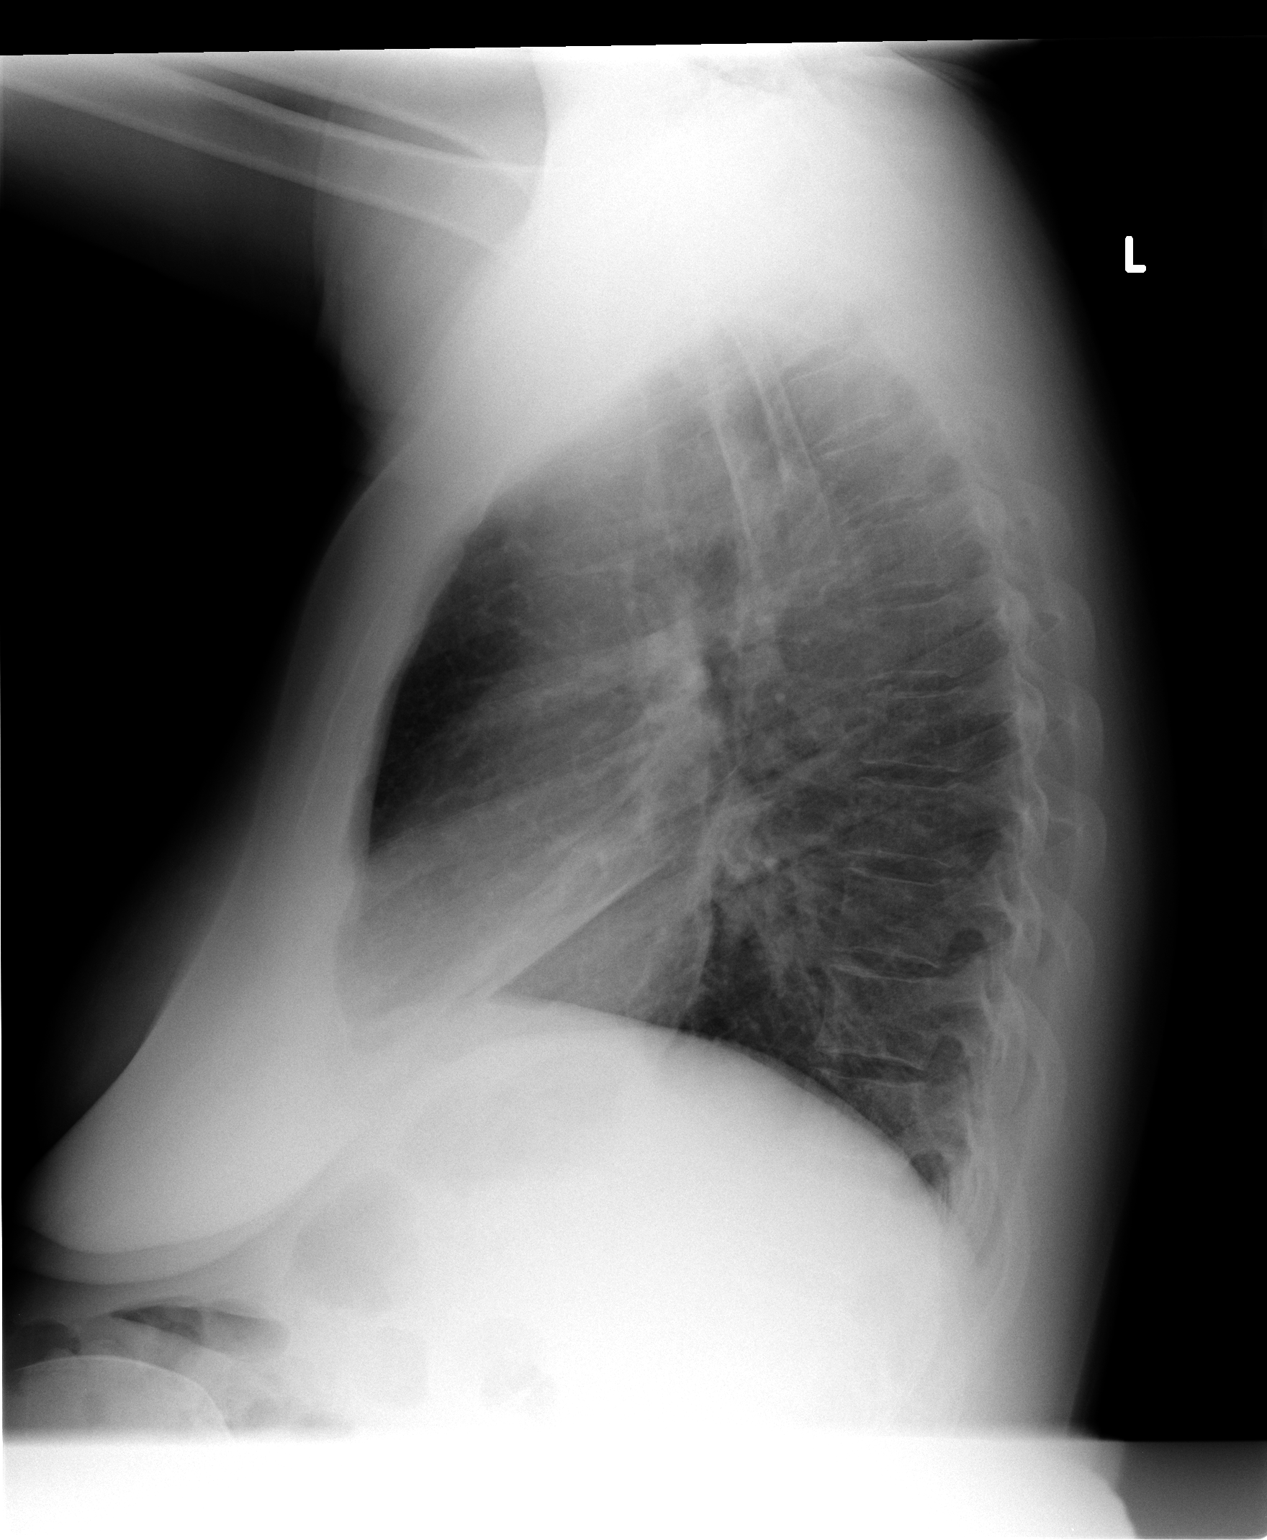

[2 of 2 positions shown; findings below may reference images not displayed]

FINDINGS: Heart size upper normal to perhaps slightly enlarged,
consistent with the gravid state.  Hilar and mediastinal contours
unremarkable.  Mildly prominent bronchovascular markings diffusely
and mild central peribronchial thickening.  Associated streaky
opacity in the lingula.  Lungs otherwise clear.  No pleural
effusions.  Visualized bony thorax intact.
IMPRESSION: Lingular atelectasis versus bronchopneumonia superimposed upon mild
changes of asthma and/or bronchitis.

## 2010-09-23 IMAGING — US US ABDOMEN COMPLETE
1 series · 14 of 25 positions shown · non-contrast
Comparison: None.

CLINICAL DATA: Pregnancy.  Flu symptoms.  Epigastric pain.
Burning for 5 days.  Nausea, vomiting.  The patient is 36 weeks.
No prior abdominal surgery.

COMPLETE ABDOMINAL ULTRASOUND

[Series 1: us abdomen complete · 0.32mm/px · 14 of 68 slices shown]
[im 1/68]
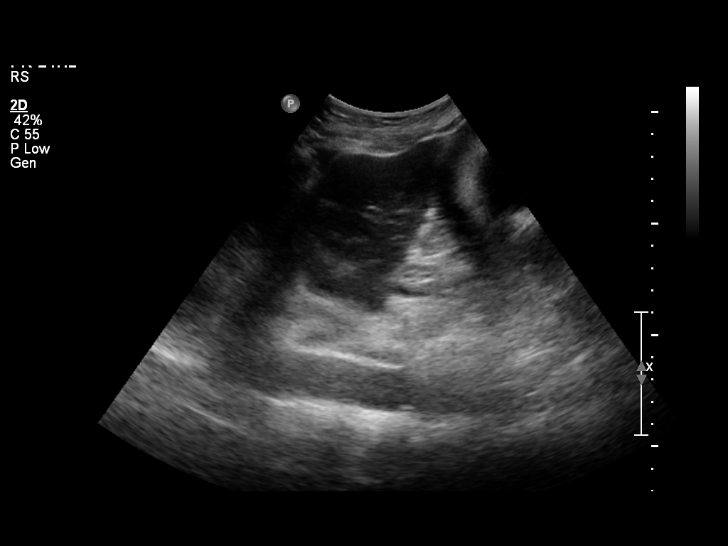
[im 6/68]
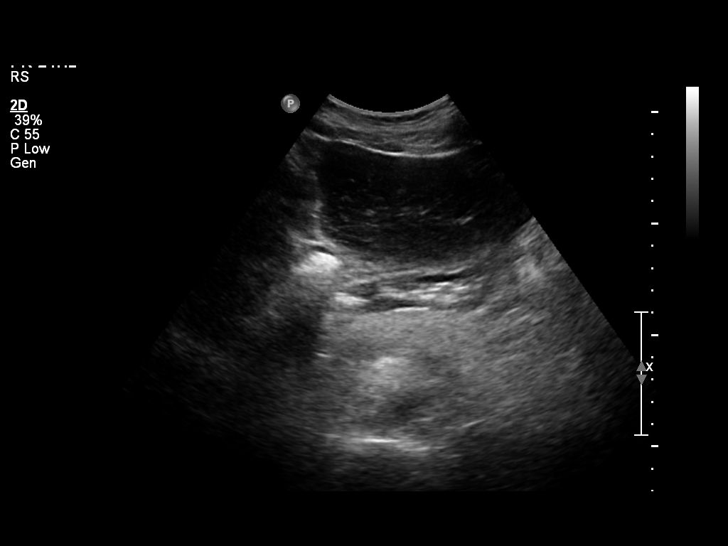
[im 12/68]
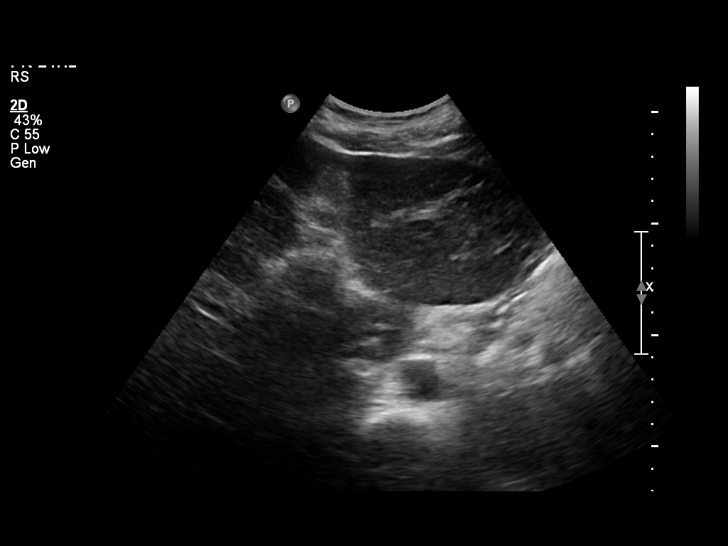
[im 17/68]
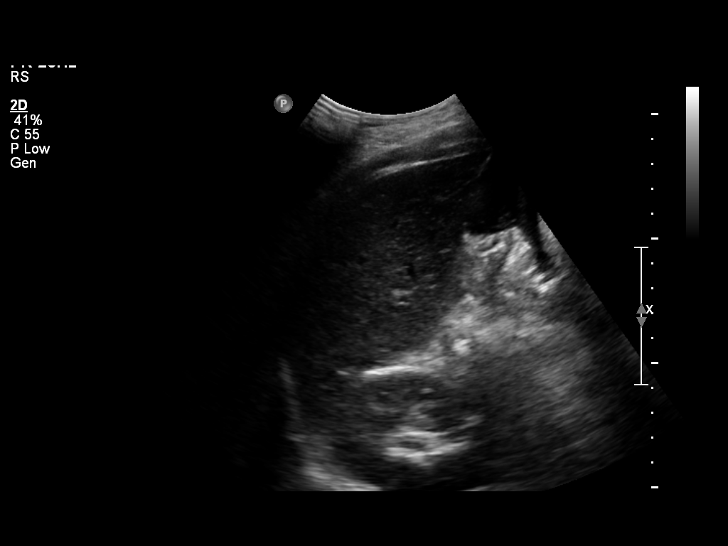
[im 23/68]
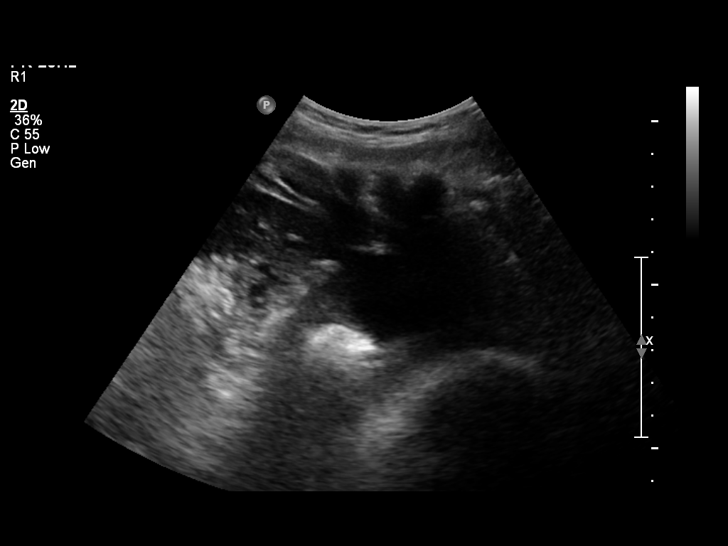
[im 26/68]
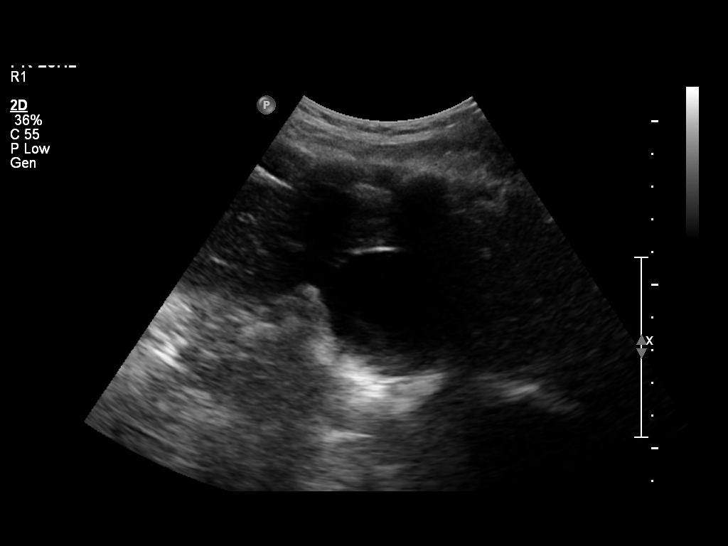
[im 31/68]
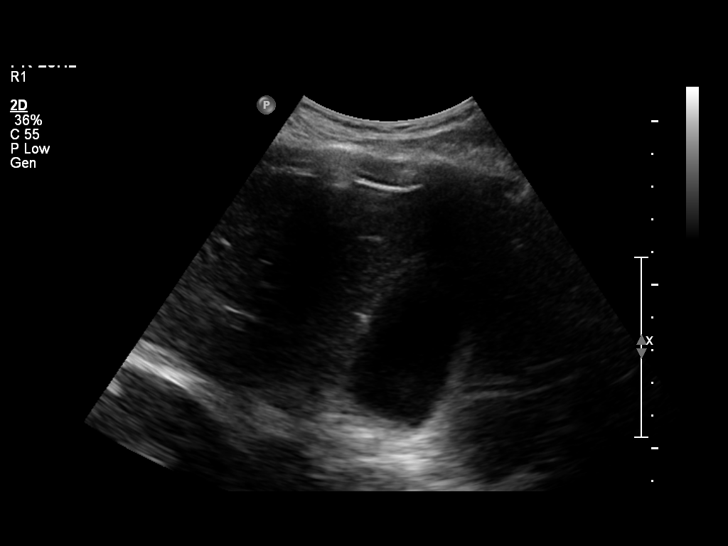
[im 37/68]
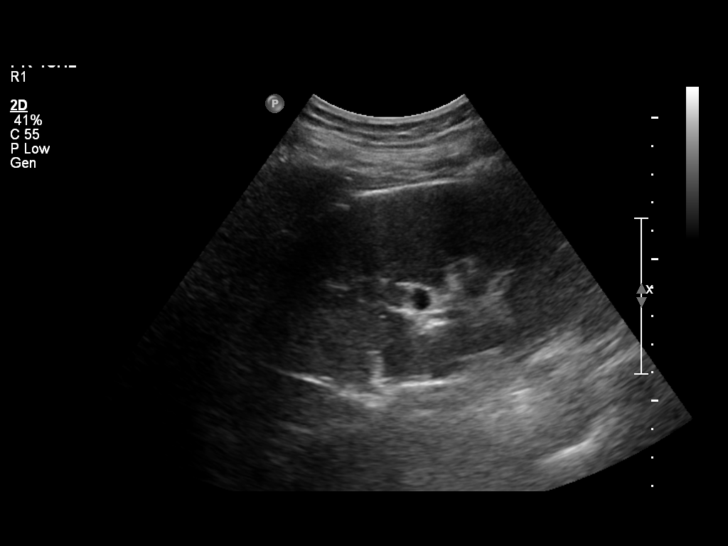
[im 42/68]
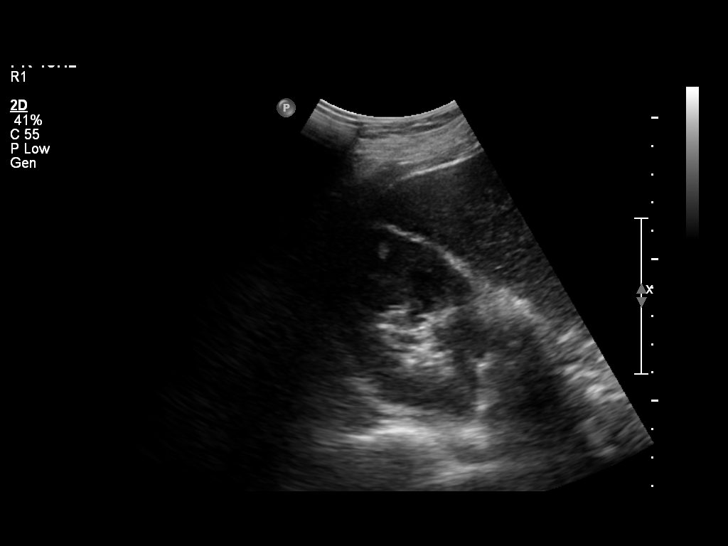
[im 45/68]
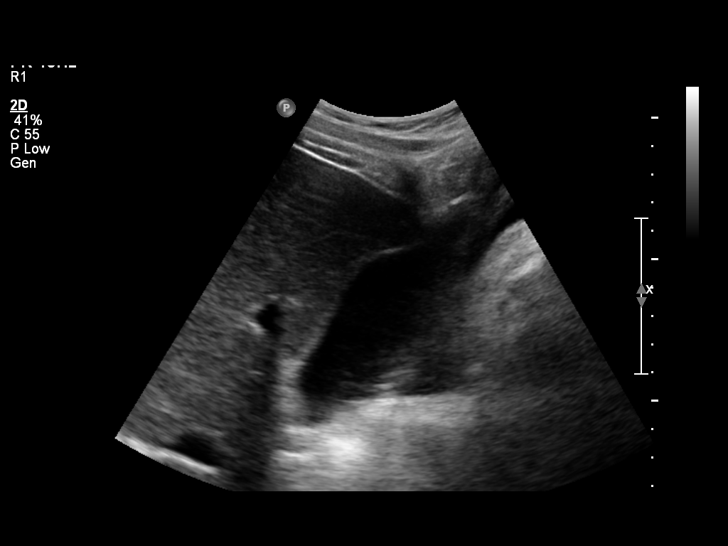
[im 51/68]
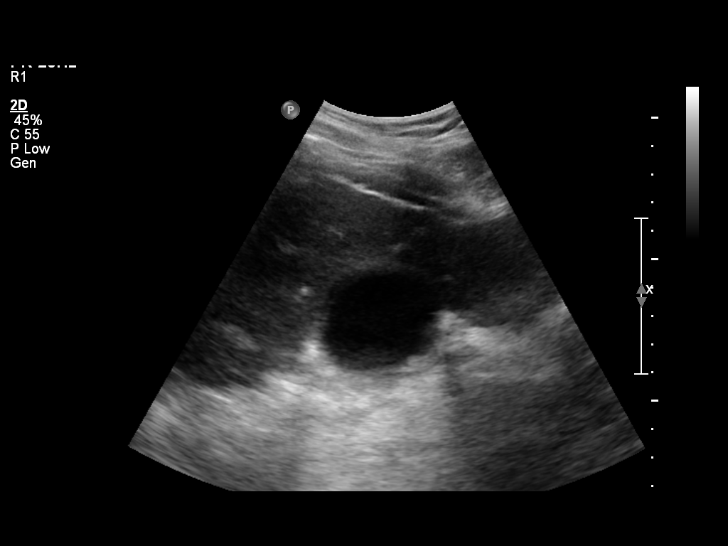
[im 56/68]
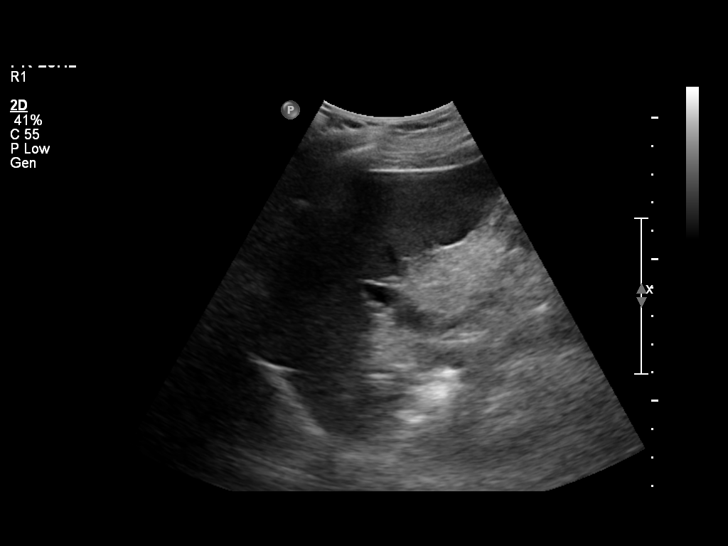
[im 62/68]
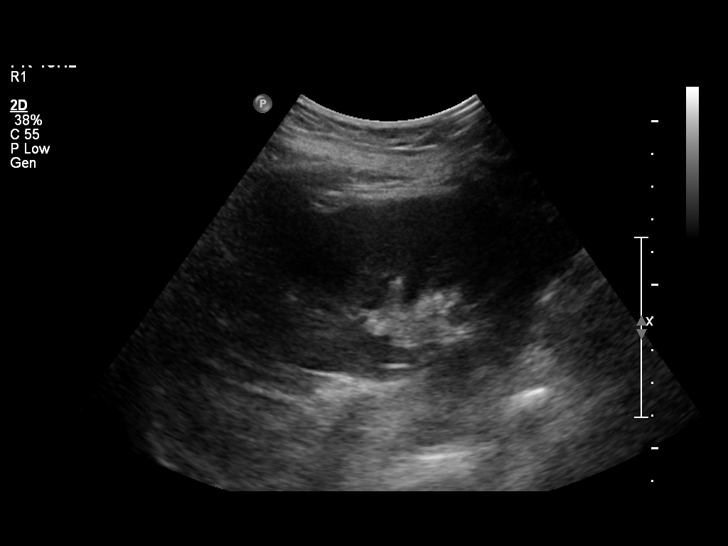
[im 68/68]
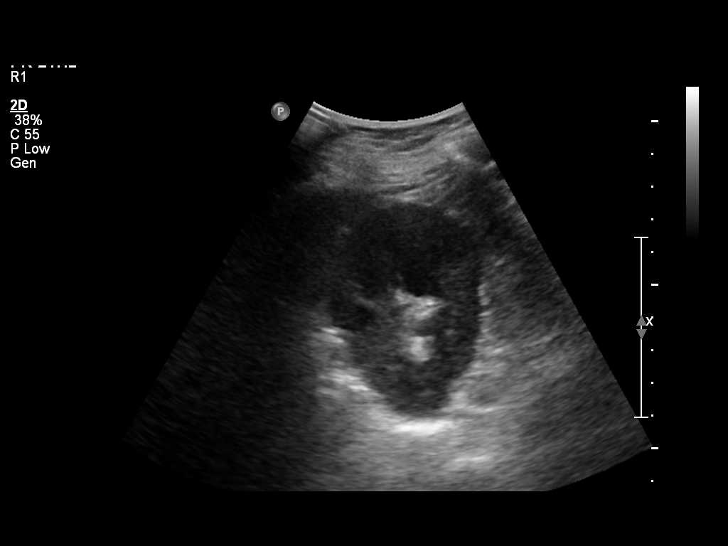

[14 of 25 positions shown; findings below may reference images not displayed]

FINDINGS: Gallbladder:  No gallstones, gallbladder wall thickening, or
pericholecystic fluid.

Common bile duct:  Measures 2 mm.

Liver:  No focal lesion identified.  Within normal limits in
parenchymal echogenicity.

IVC:  Appears normal.

Pancreas:  The pancreas is suboptimally visualized because of
overlying bowel gas.

Spleen:  The spleen measures 9.1 cm in length and has a normal
appearance

Right Kidney:  The right kidney measures 13.1 cm in length and has
a normal appearance.

Left Kidney:  The left kidney measures 12.3 cm and has a normal
appearance.

Abdominal aorta:  No aneurysm identified.
IMPRESSION: Negative abdominal ultrasound.

## 2010-09-24 LAB — POCT URINALYSIS DIP (DEVICE)
Glucose, UA: NEGATIVE mg/dL
Nitrite: NEGATIVE
Urobilinogen, UA: 2 mg/dL — ABNORMAL HIGH (ref 0.0–1.0)

## 2010-09-25 LAB — URINE MICROSCOPIC-ADD ON

## 2010-09-25 LAB — WET PREP, GENITAL
Trich, Wet Prep: NONE SEEN
Yeast Wet Prep HPF POC: NONE SEEN

## 2010-09-25 LAB — URINE CULTURE: Colony Count: 5000

## 2010-09-25 LAB — URINALYSIS, ROUTINE W REFLEX MICROSCOPIC
Leukocytes, UA: NEGATIVE
Nitrite: POSITIVE — AB
Protein, ur: NEGATIVE mg/dL
Specific Gravity, Urine: >1.03 — ABNORMAL HIGH (ref 1.005–1.030)
Urobilinogen, UA: 1 mg/dL (ref 0.0–1.0)

## 2010-09-25 LAB — GC/CHLAMYDIA PROBE AMP, GENITAL: GC Probe Amp, Genital: NEGATIVE

## 2010-09-27 LAB — URINALYSIS, ROUTINE W REFLEX MICROSCOPIC
Glucose, UA: NEGATIVE mg/dL
Hgb urine dipstick: NEGATIVE
Ketones, ur: 15 mg/dL — AB
Leukocytes, UA: NEGATIVE
Protein, ur: 30 mg/dL — AB
pH: 5.5 (ref 5.0–8.0)

## 2010-09-27 LAB — RPR: RPR Ser Ql: NONREACTIVE

## 2010-09-27 LAB — WET PREP, GENITAL: Trich, Wet Prep: NONE SEEN

## 2010-09-27 LAB — DIFFERENTIAL
Basophils Absolute: 0.1 10*3/uL (ref 0.0–0.1)
Basophils Relative: 1 % (ref 0–1)
Eosinophils Relative: 0 % (ref 0–5)
Monocytes Absolute: 0.9 10*3/uL (ref 0.1–1.0)
Monocytes Relative: 7 % (ref 3–12)

## 2010-09-27 LAB — CBC
HCT: 42.3 % (ref 36.0–46.0)
Hemoglobin: 15 g/dL (ref 12.0–15.0)
MCHC: 35.4 g/dL (ref 30.0–36.0)
MCV: 84.4 fL (ref 78.0–100.0)
RBC: 5.01 MIL/uL (ref 3.87–5.11)
RDW: 13.4 % (ref 11.5–15.5)

## 2010-09-27 LAB — POCT I-STAT, CHEM 8
Calcium, Ion: 1.14 mmol/L (ref 1.12–1.32)
Glucose, Bld: 135 mg/dL — ABNORMAL HIGH (ref 70–99)
HCT: 45 % (ref 36.0–46.0)
TCO2: 23 mmol/L (ref 0–100)

## 2010-09-27 LAB — HCG, QUANTITATIVE, PREGNANCY: hCG, Beta Chain, Quant, S: 34234 m[IU]/mL — ABNORMAL HIGH (ref <5)

## 2010-09-27 LAB — URINE MICROSCOPIC-ADD ON

## 2010-09-27 LAB — URINE CULTURE

## 2010-09-27 LAB — GC/CHLAMYDIA PROBE AMP, GENITAL: Chlamydia, DNA Probe: NEGATIVE

## 2010-10-10 IMAGING — US US OB FOLLOW-UP
1 series · 14 of 27 positions shown · non-contrast
Comparison: none

OBSTETRICAL ULTRASOUND:
 This ultrasound exam was performed in the [HOSPITAL] Ultrasound Department.  The OB US report was generated in the AS system, and faxed to the ordering physician.  This report is also available in [HOSPITAL]?s [employer] and in [REDACTED] PACS.

[Series 1: us ob follow up · 0.30mm/px · 14 of 27 slices shown]
[im 1/27]
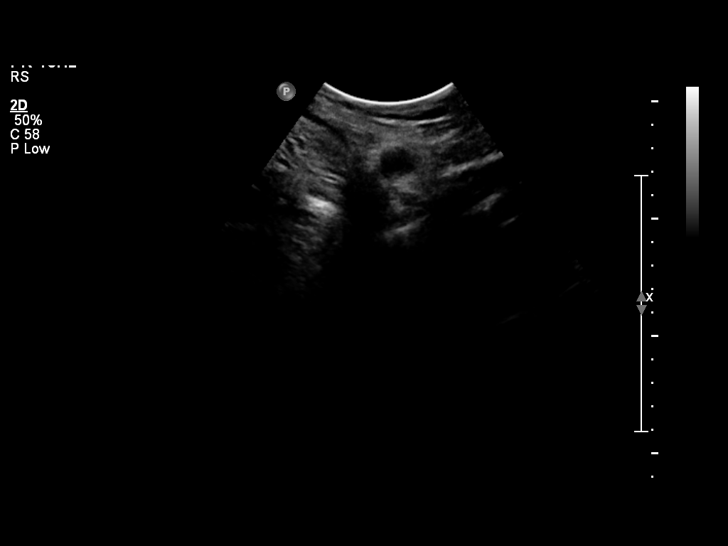
[im 3/27]
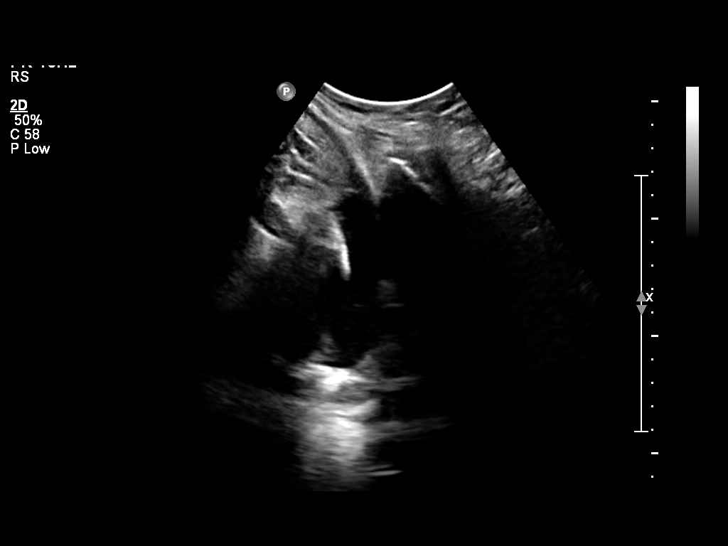
[im 5/27]
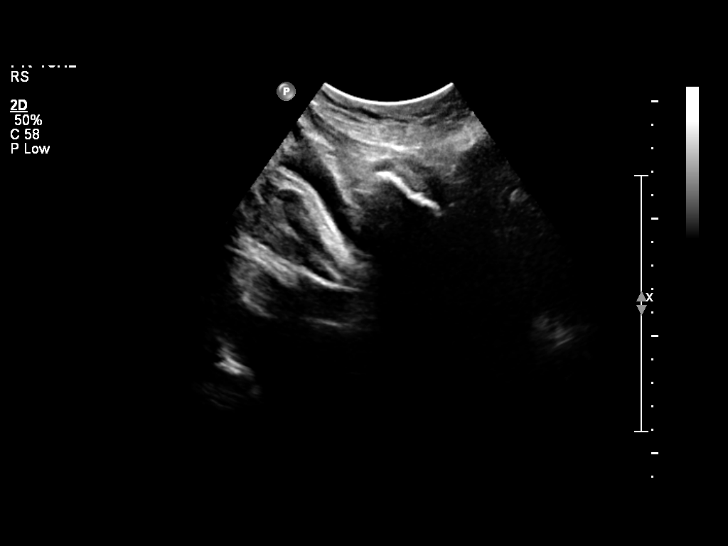
[im 7/27]
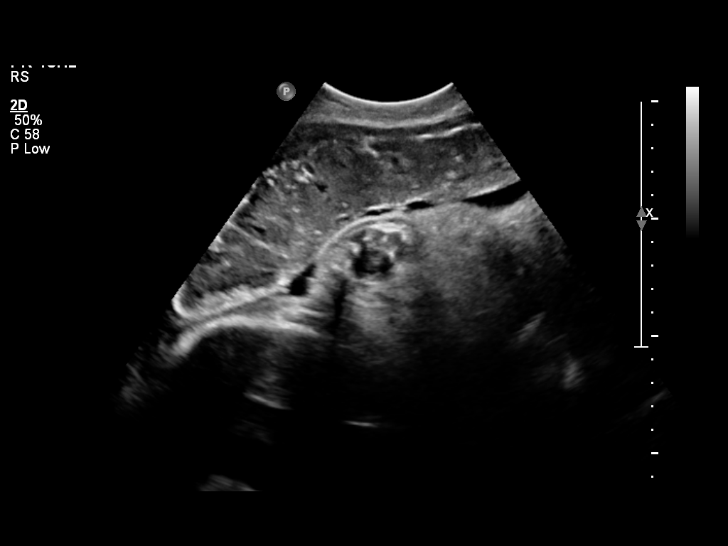
[im 9/27]
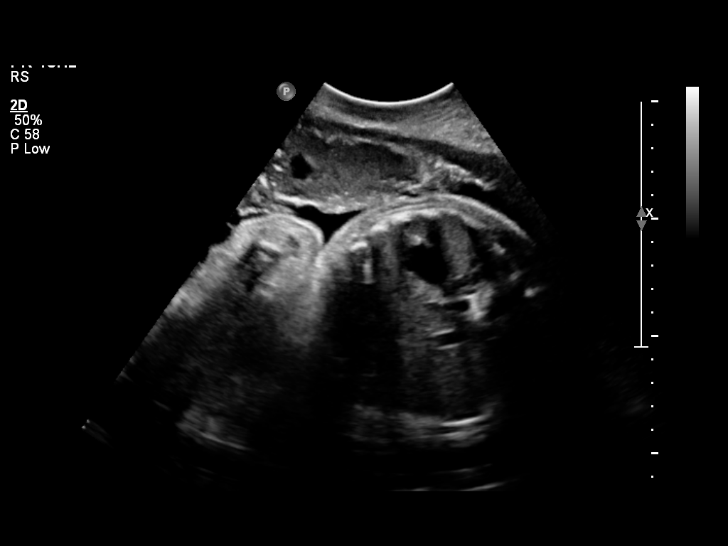
[im 11/27]
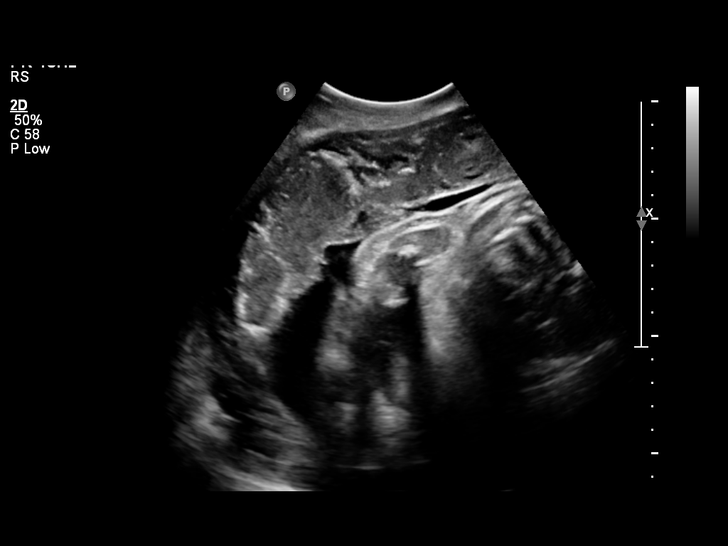
[im 13/27]
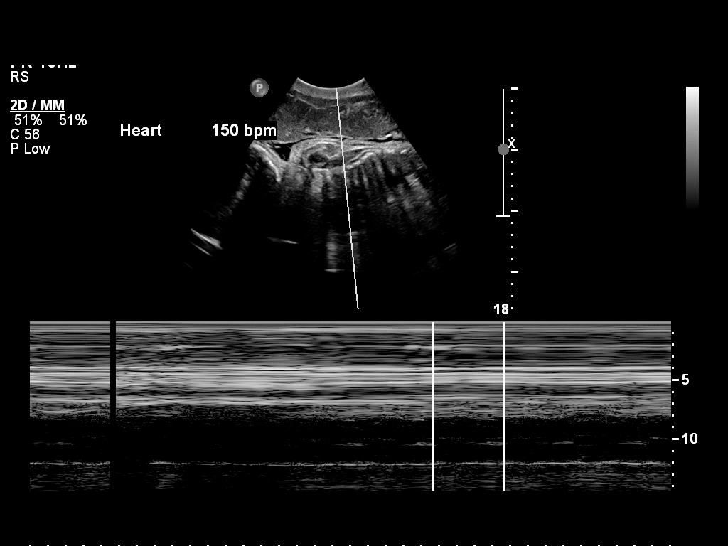
[im 15/27]
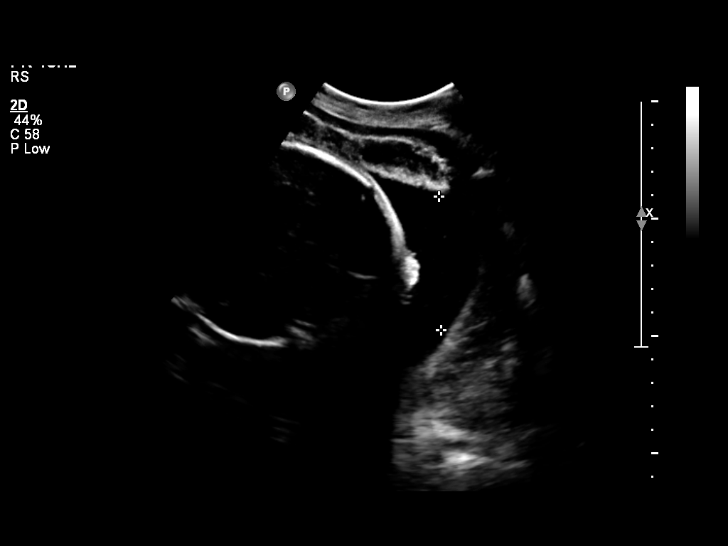
[im 17/27]
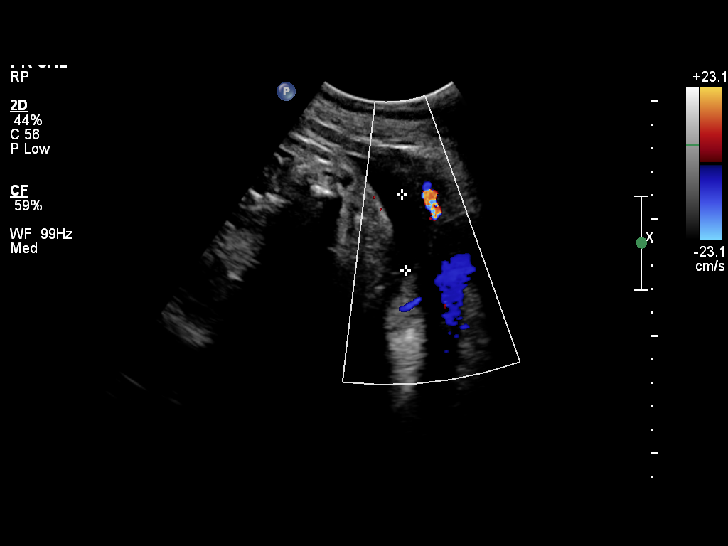
[im 19/27]
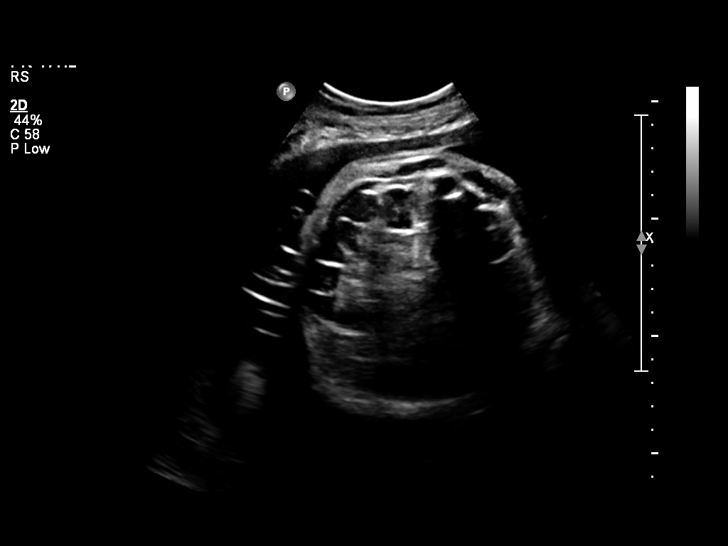
[im 21/27]
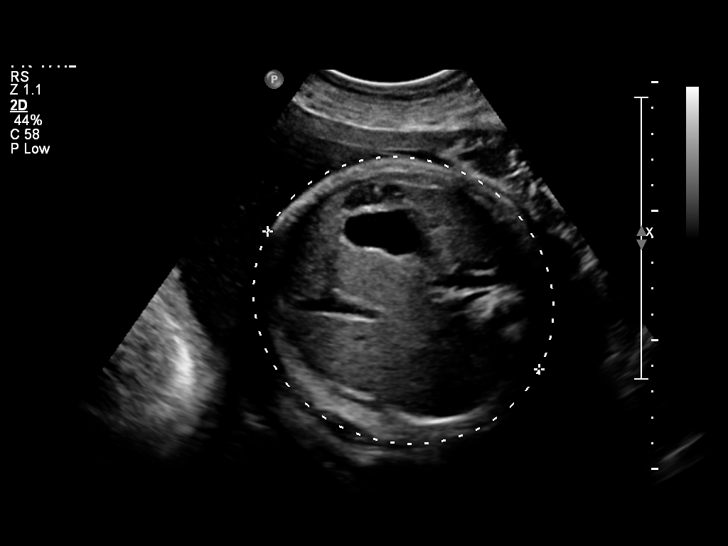
[im 23/27]
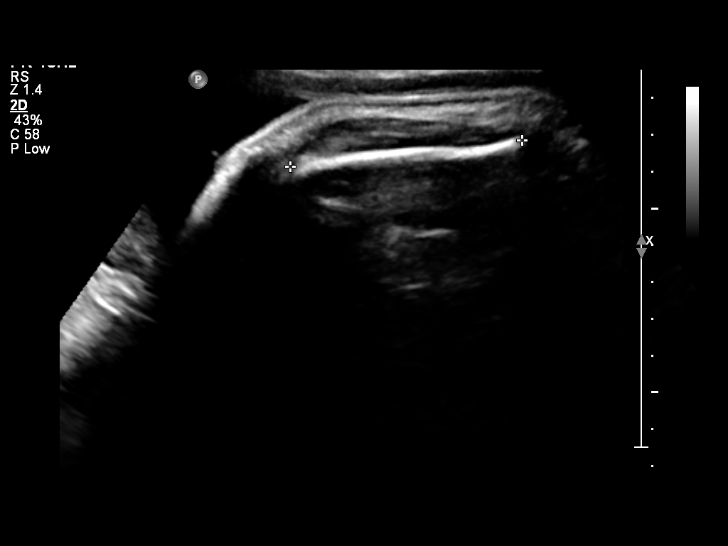
[im 25/27]
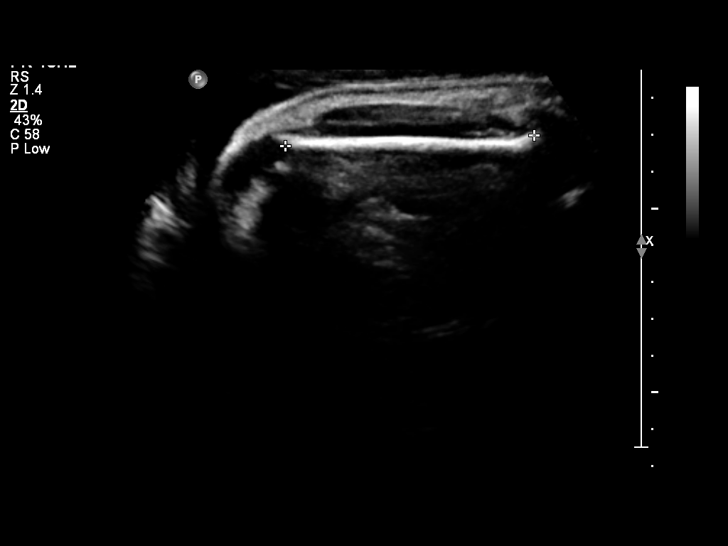
[im 27/27]
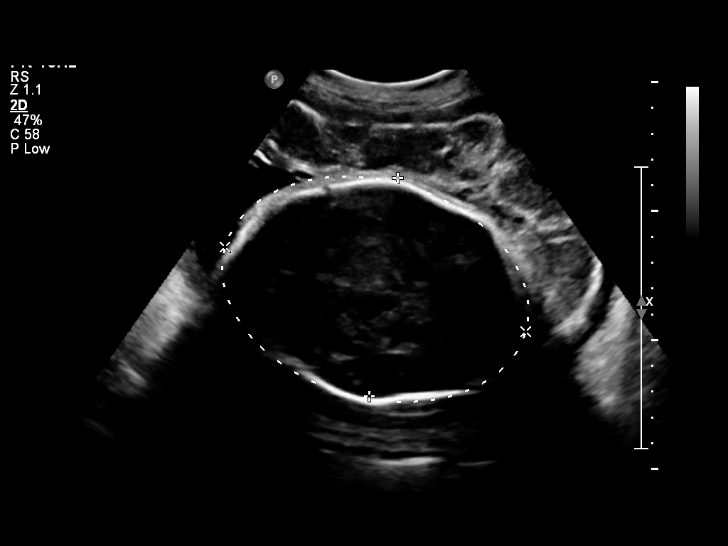

[14 of 27 positions shown; findings below may reference images not displayed]

IMPRESSION: See AS Obstetric US report.

## 2010-11-03 NOTE — Discharge Summary (Signed)
[facility]  Patient:    [name] [name], [name] [name] Visit Number: [id number] MRN: [id number]          Service Type: ANT Location: [facility] Attending Physician:  Michaelle Copas Dictated by:   Salvadore Dom, M.D. Adm. Date:  09/12/01 Disc. Date: 09/12/01                             Discharge Summary  HISTORY OF PRESENT ILLNESS:   This 40 year old, gravida 3, para 1-1-0-1, presented at 21-3/7 weeks with vaginal bleeding.  The patient had spotted since 2 a.m. the morning of admission and reports no vaginal bleeding the day prior to admission.  Denies cramping and denies drug use.  The patient does have a history of fetal demise, history of preterm delivery, and history of polysubstance abuse. She is a smoker.  She is on Wellbutrin daily.  ALLERGIES:  No known drug allergies.  PAST SURGICAL HISTORY:  In 1993 she had cesarean section secondary to preterm rupture of membranes and breech at 6-1/2 months.  In 2003, she had a 21-8/7 week PPROM with fetal demise.  GYN HISTORY:  Significant for gonorrhea and Chlamydia positive in 2001.  PAST MEDICAL HISTORY:  Significant for urinary tract infection and pyelonephritis two years ago.  Boyfriend had hepatitis C even though the patient is negative.  SOCIAL HISTORY:  She does smoke and has polysubstance abuse.  Denies alcohol.  PRENATAL LABORATORY DATA:  Normal.  PHYSICAL EXAMINATION:  VITAL SIGNS: The patient afebrile with blood pressure borderline at 141/82.  GENERAL: She was agitated and restless.  LUNGS: Clear. ABDOMEN: Soft and nontender.  EXTREMITIES: No edema.  NEUROLOGICAL: Deep tendon reflexes 1+ with no clonus.  PELVIC: Speculum examination showed clots and bloody mucous.  Digital examination showed 3, 60, and -1 with a bulging bag of water.  Fetal small parts in the cervix and the cerclage was tearing through.  HOSPITAL COURSE:  The patient was admitted for observation due to her preterm labor.   The patient initially had discussed with Dr. Orlene Erm at length and felt that she wanted to have removal of the cerclage due to the inevitability of the delivery of the fetus and the nonviability of the fetus at this time. Subsequently, the patient changed her mind on discussion with myself and Dr. Gavin Potters and after not being guaranteed that this pregnancy would end in failure, decided to try to salvage the pregnancy and agreed to the plan of care which included strict bed rest in Trendelenburg and antibiotics.  The patient was not allowed to smoke and subsequently changed her mind and asked to see myself and Dr. Gavin Potters again and reported that she did not want to continue with the plan of care.  After a long discussion involving the risks and benefits of maintaining the pregnancy as well as removal of cerclage, the patient asked that her cerclage stitch be removed.  She requested induction of labor so that in her words "things wont drag out" but this was denied due to lack of medical indication for induction of labor.  She also requested an epidural for comfort in anticipation of labor and this was done.  She also requested a cesarean section which was denied again for no medical indication. The evening on the first day of admission, the patient removed her epidural catheter and walked outside to smoke.  Due to this the epidural was discontinued.  The patient therefore became very agitated  and wanted to leave. I spoke with her at length, but despite my medical discussion of the risks she wanted to leave.  She was therefore discharged home and requested to follow up in maternity admissions unit. Dictated by:   Salvadore Dom, M.D. Attending Physician:  Michaelle Copas DD:  11/13/01 TD:  11/14/01 Job: [id number]

## 2010-11-03 NOTE — Discharge Summary (Signed)
[facility]  Patient:    SHATAYA, WINKLES Visit Number: [id number] MRN: [id number]          Service Type: PSY Location: 300 [id number] [id number] Attending Physician:  Rachael Fee Dictated by:   Reymundo Poll Dub Mikes, M.D. Admit Date:  10/06/2001 Discharge Date: 10/08/2001                             Discharge Summary  CHIEF COMPLAINT AND HISTORY OF PRESENT ILLNESS:  This was the first admission to [facility] for this 40 year old female voluntarily admitted due to depression, history of drug use.  Relapsed five days prior to this admission.  Claimed that the boyfriend wanted her to get help, lost a child.  Denied being actively suicidal but claimed she said that just to get some help.  Claimed that she was motivated to work on her long-term abstinence and was willing to pursue outpatient treatment once discharged.  She has been using crack, relapsed after five months clean.  Claimed to a nondrinker.  PAST PSYCHIATRIC HISTORY:  First time at [facility].  Had been at [facility] six times and has a therapist there.  SUBSTANCE ABUSE HISTORY:  Relapsed on crack, as already stated, after being clean for five months.  PAST MEDICAL HISTORY:  Denies history of any major medical conditions.  MEDICATIONS ON ADMISSION: 1. Zoloft 25 mg for the last one week. 2. She was given Xanax 0.5 mg for a week.  PHYSICAL EXAMINATION:  GENERAL:  Performed and failed to show any acute findings.  MENTAL STATUS EXAMINATION ON ADMISSION:  Alert female, casually dressed, good eye contact.  Speech was normal and relevant.  Mood: Anxious, depressed. Affect: Depressed, anxious.  Thought processes: Coherent  Content dealt with the events that led her to be admitted, her wanting to maintain long-term abstinence.  There were no active suicidal or homicidal ideations, no auditory or visual hallucinations.  Cognitive: Well preserved.  ADMITTING DIAGNOSES: Axis  I:    1. Depressive disorder, not otherwise specified.            2. Cocaine dependence. Axis II:   No diagnosis. Axis III:  No diagnosis. Axis IV:   Moderate. Axis V:    Global assessment of functioning upon admission 35, highest global            assessment of functioning in the last year 65.  HOSPITAL COURSE:  On April 23 on hospital day #2, she felt she was much better.  She had a plan on how to keep herself clean long-term.  She denied any suicidal ideas, no homicidal ideas.  She was going to go to [facility] for further work on long-term sobriety.  Expressed increased understanding of her addiction, her triggers, and had worked on a relapse prevention plan.  DISCHARGE DIAGNOSES: Axis I:    1. Depressive disorder, not otherwise specified.            2. Cocaine dependence. Axis II:   No diagnosis. Axis III:  No diagnosis. Axis IV:   Moderate. Axis V:    Global assessment of functioning upon discharge 50-55.  DISCHARGE MEDICATIONS: 1. Wellbutrin slow release 100 mg in the morning. 2. Zoloft 50 mg twice a day.  FOLLOWUP:  [facility]. Dictated by:   Reymundo Poll Dub Mikes, M.D. Attending Physician:  Rachael Fee DD:  11/12/01 TD:  11/14/01 Job: [id number]

## 2010-11-03 NOTE — Op Note (Signed)
[facility]  Patient:    MILIYAH, LUPER Visit Number: [id number] MRN: [id number]          Service Type: MED Location: [facility] Attending Physician:  Antionette Char Dictated by:   Conni Elliot, M.D. Proc. Date: 07/05/01 Admit Date:  07/07/2001                             Operative Report  PREOPERATIVE DIAGNOSIS:       Decreased cervical resistance at 11-12 weeks pregnancy.  POSTOPERATIVE DIAGNOSIS:      Decreased cervical resistance at 11-12 weeks pregnancy.  OPERATION:                    McDonald cerclage.  SURGEON:                      Conni Elliot, M.D.  ANESTHESIA:                   Spinal.  OPERATIVE FINDINGS:           The cervix was fingertip.  External os closed. Internal os definitely shortened.  Of note, the vaginal rugae went down almost to the full length of the short cervix.  OPERATIVE PROCEDURE:          After placement of spinal anesthetic with patient in supine dorsal lithotomy position perineum and vagina were prepped with ______ solution.  A weighted speculum was placed in the posterior vagina. Cervix was grasped with sponge stick.  Cleocin douche was placed after Foley catheter was also placed.  A cervical cerclage was placed starting at 12 oclock in a counterclockwise fashion using #4 double suture.  The suture was tied anteriorly at 12 oclock.  Hemostasis was adequate.  Estimated blood loss was less than 10 cc.  Needle, sponge count correct. Dictated by:   Conni Elliot, M.D. Attending Physician:  Antionette Char DD:  07/05/00 TD:  07/07/01 Job: [id number]

## 2010-11-03 NOTE — Discharge Summary (Signed)
[facility]  Patient:    [name] [name] Visit Number: [id number] MRN: [id number]          Service Type: MED Location: [facility] Attending Physician:  Antionette Char Dictated by:   Emelda Fear, M.D. Admit Date:  07/07/2001 Disc. Date: 07/05/01                             Discharge Summary  CONSULTS:                     None.  PROCEDURE:                    Cerclage placement.  DISCHARGE DIAGNOSES:          1. Intrauterine pregnancy at 11 weeks 2 days                                  gestation.                               2. Decreased cervical resistance/weak cervix                                  status post cerclage placement.                               3. Tobacco abuse.  DISCHARGE MEDICATIONS:        1. Ibuprofen 200 mg 2 tablets p.o. q.4h.                                  x1 day.                               2. Percocet 5/325 one tablet q.4-h. p.r.n.                                  pain, dispense 12, no refills.                               3. Prenatal vitamins 1 tablet p.o. q.d. while                                  pregnant.                               4. Phenergan 12.5 mg 1 tablet p.o. q.6h.                                  p.r.n. nausea.  HOSPITAL COURSE:              [name] [name] is a 40 year old G3, P0-1-1-1, at 9 and 3/[redacted] weeks gestation admitted secondary to history of decreased cervical resistance.  Wet prep on  presentation was negative with few wbcs and few bacteria.  GC and chlamydia probes were negative.  Group B strep was negative on presentation.  The patient is with a recent history of bacterial vaginosis with a complete seven-day course of Flagyl therapy by her primary care physician, Dr. Solon Palm.  The patient was admitted and on hospital day #2, underwent cerclage placement by Dr. Corky Sox.  The patient handled the procedure well.  Upon hospitalization, the patient was initiated on  Unasyn therapy as well as motion therapy.  The patient is to continue motion therapy for approximately two days postoperatively.  Due to the patients requesting discharge, the physician team and the patient reviewed the importance of strict bed rest for the following week status post cerclage placement.  The patient is in agreement with the plan and agrees to motion therapy for the following day after discharge.  The patients vital signs remained stable and she remained afebrile throughout admission.  DISCHARGE INSTRUCTIONS:       Preterm labor signs and symptoms were reviewed in detail with the patient.  She is to call the doctor or present to [facility] for menstrual-like cramping, uterine contractions, dull back pain, pelvic pressure, or vaginal bleeding.  Activity:  Strict bed rest, except for bathroom, shower privileges for the following one week.  Sexual activity:  The patient is not to have any further sexual activity until delivery of infant. Followup:  The patient is to call for an appointment with Dr. Gavin Potters for Friday, January 24.  The patient was provided with the office phone number. Dictated by:   Emelda Fear, M.D. Attending Physician:  Antionette Char DD:  07/05/01 TD:  07/07/01 Job: [id number]

## 2010-11-03 NOTE — Discharge Summary (Signed)
[facility]  Patient:    [name] [name], [name] [name] Visit Number: [id number] MRN: [id number]          Service Type: PSY Location: 300 0303 01 Attending Physician:  Rachael Fee Dictated by:   Ed Blalock. Burnadette Peter, M.D. Admit Date:  10/06/2001 Discharge Date: 10/08/2001                             Discharge Summary  HISTORY AND HOSPITAL COURSE:     This 40 year old G3, P0-1-1-1 presented in active preterm labor complete with bulging bag of membranes per Dr. Freida Busman exam.  The patient was delivered of a previable fetus with Apgars 1, 1, and 1 at one, five, and ten minutes, respectively.  The infant subsequently expired. After her delivery, the patient requested to be discharged from the hospital. After discussion with the patient, I explained to her that she was likely infected with chorioamnionitis and should stay for IV antibiotics.  She initially agreed to this.  While I was not available for discussion with the patient, she elected to leave against medical advice.  She signed the paperwork and left the hospital.  Attempts to persuade the patient to stay by the nurse and by myself over the phone were unsuccessful.  DISCHARGE CONDITION:             Delivered.  DISCHARGE DIAGNOSES:             1. Preterm delivery.                                  2. Chorioamnionitis.                                  3. Known substance abuse. Dictated by:   Ed Blalock. Burnadette Peter, M.D. Attending Physician:  Rachael Fee DD:  11/13/01 TD:  11/15/01 Job: [id number]

## 2010-11-03 NOTE — Op Note (Signed)
[facility]  Patient:    [name] [name], [name] [name]                     MRN: [id number] Proc. Date: 11/30/00 Adm. Date:  [id number] Attending:  Tammi Sou                           Operative Report  PREOPERATIVE DIAGNOSIS:       Retained products of conception.  POSTOPERATIVE DIAGNOSIS:      Retained products of conception.  OPERATION:                    Dilation and evacuation.  SURGEON:                      Conni Elliot, M.D.  ANESTHESIA:                   MAC.  DESCRIPTION OF PROCEDURE:     The patient was placed in the dorsal lithotomy position.  The perineum and vagina were prepped with Betadine solution.  A weighted speculum was placed in the posterior vagina.  The patient was moved to the Trendelenburg position.  A tenaculum was placed on the anterior lip f the cervix.  A banjo curet was then utilized to complete the removal of the products of conception.  Estimated blood loss was less than 300 cc.  ______ count was correct. DD:  11/30/00 TD:  11/30/00 Job: [id number]

## 2011-06-03 ENCOUNTER — Emergency Department (HOSPITAL_COMMUNITY): Payer: Medicaid Other

## 2011-06-03 ENCOUNTER — Encounter: Payer: Self-pay | Admitting: *Deleted

## 2011-06-03 ENCOUNTER — Emergency Department (HOSPITAL_COMMUNITY)
Admission: EM | Admit: 2011-06-03 | Discharge: 2011-06-03 | Disposition: A | Discharge status: Home / Self Care | Payer: Medicaid Other | Attending: Emergency Medicine | Admitting: Emergency Medicine

## 2011-06-03 DIAGNOSIS — S0003XA Contusion of scalp, initial encounter: Secondary | ICD-10-CM | POA: Insufficient documentation

## 2011-06-03 DIAGNOSIS — T148XXA Other injury of unspecified body region, initial encounter: Secondary | ICD-10-CM

## 2011-06-03 DIAGNOSIS — R51 Headache: Secondary | ICD-10-CM | POA: Insufficient documentation

## 2011-06-03 DIAGNOSIS — S1093XA Contusion of unspecified part of neck, initial encounter: Secondary | ICD-10-CM | POA: Insufficient documentation

## 2011-06-03 HISTORY — DX: Anxiety disorder, unspecified: F41.9

## 2011-06-03 MED ORDER — HYDROCODONE-ACETAMINOPHEN 5-325 MG PO TABS
1.0000 | ORAL_TABLET | Freq: Once | ORAL | Status: AC
  Administered 2011-06-03: 1 via ORAL
  Filled 2011-06-03: qty 1

## 2011-06-03 MED ORDER — HYDROCODONE-ACETAMINOPHEN 5-325 MG PO TABS: 2.0000 | ORAL_TABLET | ORAL | Status: AC | PRN

## 2011-06-03 NOTE — ED Notes (Signed)
Patient transported to X-ray 
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
## 2013-08-19 ENCOUNTER — Other Ambulatory Visit: Payer: Self-pay

## 2014-04-26 ENCOUNTER — Telehealth: Payer: Self-pay

## 2014-04-26 NOTE — Telephone Encounter (Signed)
Error

## 2023-01-17 DEATH — deceased
